# Patient Record
Sex: Male | Born: 1940 | Race: Black or African American | Hispanic: No | Marital: Married | Smoking: Never smoker
Health system: Southern US, Community
[De-identification: ages and names within clinical notes are randomized; demographics above are authoritative.]

## PROBLEM LIST (undated history)

## (undated) DIAGNOSIS — N529 Male erectile dysfunction, unspecified: Secondary | ICD-10-CM

## (undated) DIAGNOSIS — Z992 Dependence on renal dialysis: Secondary | ICD-10-CM

## (undated) DIAGNOSIS — R570 Cardiogenic shock: Secondary | ICD-10-CM

## (undated) DIAGNOSIS — J189 Pneumonia, unspecified organism: Secondary | ICD-10-CM

## (undated) DIAGNOSIS — I10 Essential (primary) hypertension: Secondary | ICD-10-CM

## (undated) DIAGNOSIS — I509 Heart failure, unspecified: Secondary | ICD-10-CM

## (undated) DIAGNOSIS — Z95810 Presence of automatic (implantable) cardiac defibrillator: Secondary | ICD-10-CM

## (undated) DIAGNOSIS — E119 Type 2 diabetes mellitus without complications: Secondary | ICD-10-CM

## (undated) DIAGNOSIS — N186 End stage renal disease: Secondary | ICD-10-CM

## (undated) HISTORY — DX: Essential (primary) hypertension: I10

## (undated) HISTORY — PX: GANGLION CYST EXCISION: SHX1691

## (undated) HISTORY — PX: CARPAL TUNNEL RELEASE: SHX101

## (undated) HISTORY — DX: Male erectile dysfunction, unspecified: N52.9

## (undated) HISTORY — DX: Cardiogenic shock: R57.0

## (undated) HISTORY — PX: ARTERIOVENOUS GRAFT PLACEMENT: SUR1029

---

## 2004-09-27 HISTORY — PX: SHUNT EXTERNALIZATION: SHX341

## 2005-09-27 HISTORY — PX: CATARACT EXTRACTION W/ INTRAOCULAR LENS  IMPLANT, BILATERAL: SHX1307

## 2009-09-27 DIAGNOSIS — R570 Cardiogenic shock: Secondary | ICD-10-CM

## 2009-09-27 DIAGNOSIS — Z95810 Presence of automatic (implantable) cardiac defibrillator: Secondary | ICD-10-CM

## 2009-09-27 HISTORY — DX: Cardiogenic shock: R57.0

## 2009-09-27 HISTORY — PX: CARDIAC DEFIBRILLATOR PLACEMENT: SHX171

## 2009-09-27 HISTORY — DX: Presence of automatic (implantable) cardiac defibrillator: Z95.810

## 2013-01-25 ENCOUNTER — Encounter: Payer: Self-pay | Admitting: Internal Medicine

## 2013-02-09 ENCOUNTER — Encounter (INDEPENDENT_AMBULATORY_CARE_PROVIDER_SITE_OTHER): Payer: Self-pay | Admitting: Ophthalmology

## 2013-02-14 ENCOUNTER — Encounter (INDEPENDENT_AMBULATORY_CARE_PROVIDER_SITE_OTHER): Payer: Medicare Other | Admitting: Ophthalmology

## 2013-02-14 DIAGNOSIS — E11359 Type 2 diabetes mellitus with proliferative diabetic retinopathy without macular edema: Secondary | ICD-10-CM

## 2013-02-14 DIAGNOSIS — H35039 Hypertensive retinopathy, unspecified eye: Secondary | ICD-10-CM

## 2013-02-14 DIAGNOSIS — I10 Essential (primary) hypertension: Secondary | ICD-10-CM

## 2013-02-14 DIAGNOSIS — E1139 Type 2 diabetes mellitus with other diabetic ophthalmic complication: Secondary | ICD-10-CM

## 2013-02-14 DIAGNOSIS — H4310 Vitreous hemorrhage, unspecified eye: Secondary | ICD-10-CM

## 2013-02-14 DIAGNOSIS — H43819 Vitreous degeneration, unspecified eye: Secondary | ICD-10-CM

## 2013-02-23 ENCOUNTER — Encounter (INDEPENDENT_AMBULATORY_CARE_PROVIDER_SITE_OTHER): Payer: Medicare Other | Admitting: Ophthalmology

## 2013-03-02 ENCOUNTER — Encounter (INDEPENDENT_AMBULATORY_CARE_PROVIDER_SITE_OTHER): Payer: Medicare Other | Admitting: Ophthalmology

## 2013-03-05 ENCOUNTER — Encounter (INDEPENDENT_AMBULATORY_CARE_PROVIDER_SITE_OTHER): Payer: Medicare Other | Admitting: Ophthalmology

## 2013-03-05 DIAGNOSIS — E1139 Type 2 diabetes mellitus with other diabetic ophthalmic complication: Secondary | ICD-10-CM

## 2013-03-05 DIAGNOSIS — E11359 Type 2 diabetes mellitus with proliferative diabetic retinopathy without macular edema: Secondary | ICD-10-CM

## 2013-03-06 ENCOUNTER — Encounter: Payer: Self-pay | Admitting: *Deleted

## 2013-03-07 ENCOUNTER — Encounter: Payer: Self-pay | Admitting: Internal Medicine

## 2013-03-07 ENCOUNTER — Ambulatory Visit (INDEPENDENT_AMBULATORY_CARE_PROVIDER_SITE_OTHER): Payer: Medicare Other | Admitting: Internal Medicine

## 2013-03-07 VITALS — BP 168/78 | HR 64 | Ht 70.0 in | Wt 277.8 lb

## 2013-03-07 DIAGNOSIS — I498 Other specified cardiac arrhythmias: Secondary | ICD-10-CM

## 2013-03-07 DIAGNOSIS — I10 Essential (primary) hypertension: Secondary | ICD-10-CM

## 2013-03-07 DIAGNOSIS — R55 Syncope and collapse: Secondary | ICD-10-CM

## 2013-03-07 NOTE — Patient Instructions (Addendum)
Your physician wants you to follow-up in: 12 months with Dr Allred You will receive a reminder letter in the mail two months in advance. If you don't receive a letter, please call our office to schedule the follow-up appointment.    Remote monitoring is used to monitor your Pacemaker of ICD from home. This monitoring reduces the number of office visits required to check your device to one time per year. It allows us to keep an eye on the functioning of your device to ensure it is working properly. You are scheduled for a device check from home on 06/11/13. You may send your transmission at any time that day. If you have a wireless device, the transmission will be sent automatically. After your physician reviews your transmission, you will receive a postcard with your next transmission date.   

## 2013-03-09 ENCOUNTER — Telehealth: Payer: Self-pay | Admitting: Internal Medicine

## 2013-03-09 DIAGNOSIS — Z95810 Presence of automatic (implantable) cardiac defibrillator: Secondary | ICD-10-CM | POA: Insufficient documentation

## 2013-03-09 LAB — ICD DEVICE OBSERVATION
AL IMPEDENCE ICD: 350 Ohm
ATRIAL PACING ICD: 19 pct
BAMS-0003: 80 {beats}/min
CHARGE TIME: 9.6 s
DEV-0020ICD: NEGATIVE
HV IMPEDENCE: 44 Ohm
RV LEAD IMPEDENCE ICD: 440 Ohm
TOT-0006: 20111025000000
TOT-0007: 0
TOT-0008: 0
TOT-0009: 0
TOT-0010: 8
TZAT-0012SLOWVT: 200 ms
TZAT-0013SLOWVT: 3
TZAT-0018SLOWVT: NEGATIVE
TZAT-0020SLOWVT: 1 ms
TZON-0003SLOWVT: 315 ms
TZON-0005SLOWVT: 6
TZST-0001SLOWVT: 2
TZST-0001SLOWVT: 4
TZST-0001SLOWVT: 5
TZST-0003SLOWVT: 36 J

## 2013-03-09 NOTE — Telephone Encounter (Signed)
ROI faxed to [facility] at [phone] 03/09/13/km

## 2013-03-19 ENCOUNTER — Other Ambulatory Visit (INDEPENDENT_AMBULATORY_CARE_PROVIDER_SITE_OTHER): Payer: Medicare Other | Admitting: Ophthalmology

## 2013-03-21 ENCOUNTER — Encounter: Payer: Self-pay | Admitting: Internal Medicine

## 2013-03-21 DIAGNOSIS — I10 Essential (primary) hypertension: Secondary | ICD-10-CM | POA: Insufficient documentation

## 2013-03-21 DIAGNOSIS — R55 Syncope and collapse: Secondary | ICD-10-CM | POA: Insufficient documentation

## 2013-03-21 NOTE — Progress Notes (Signed)
Primary Cardiologist:  Dr Charlesetta Ivory is a 72 y.o. male with a h/o syncope sp ICD (SJM) by Dr Mikael Spray at [facility] cardiology in [place] who presents today to establish care in the Electrophysiology device clinic.  He reports having several episodes of syncope.  He recalls having a family history of sudden death and therefore had an ICD implanted.  He is unable to provide any additional information about the decisions related to his device implantation.  He recently moved to our area.  The patient reports doing very well since having a defibrillator implanted and remains very active despite his age.   Today, he  denies symptoms of palpitations, chest pain, shortness of breath, orthopnea, PND, lower extremity edema, dizziness, presyncope, syncope, or neurologic sequela.  The patientis tolerating medications without difficulties and is otherwise without complaint today.   Past Medical History  Diagnosis Date  . End stage renal disease     on dialysis in [place]  . Cardiac defibrillator in place 2011    for syncope with FH of sudden death  . Diabetes mellitus   . Erectile dysfunction   . HTN (hypertension)   . Cardiovascular collapse 2011   Past Surgical History  Procedure Laterality Date  . Ganglion cyst excision    . Cataracts Bilateral 2007  . Cardiac defibrillator placement  2011    [employer] medical, implanted in [place] for syncope with FH of sudden death  . Shunt externalization  2006    History   Social History  . Marital Status: Married    Spouse Name: N/A    Number of Children: 3  . Years of Education: N/A   Occupational History  . retired     Tourist information centre manager   Social History Main Topics  . Smoking status: Never Smoker   . Smokeless tobacco: Not on file  . Alcohol Use: No  . Drug Use: No  . Sexually Active: Not on file   Other Topics Concern  . Not on file   Social History Narrative  . No narrative on file    Family  History  Problem Relation Age of Onset  . Diabetes Father     deceased  . Hypertension Father     deceased  . Diabetes Mother     deceased  . Hypertension Mother     deceased  . Coronary artery disease Mother     deceased  . Coronary artery disease Maternal Grandmother   . Diabetes Maternal Grandmother     No Known Allergies  Current Outpatient Prescriptions  Medication Sig Dispense Refill  . aspirin 81 MG tablet Take 81 mg by mouth daily.      . ferrous sulfate 325 (65 FE) MG tablet Take 325 mg by mouth 2 (two) times daily.      . insulin aspart (NOVOLOG) 100 UNIT/ML injection Inject 15 Units into the skin 3 (three) times daily with meals.      . metoprolol (LOPRESSOR) 50 MG tablet Take 50 mg by mouth 2 (two) times daily.      . Multiple Vitamins-Minerals (CENTRUM SILVER PO) Take by mouth daily.      . sevelamer carbonate (RENVELA) 800 MG tablet Take 800 mg by mouth 3 (three) times daily with meals.        No current facility-administered medications for this visit.    ROS- all systems are reviewed and negative except as per HPI  Physical Exam: Filed Vitals:  03/07/13 1523  BP: 168/78  Pulse: 64  Height: 5\' 10"  (1.778 m)  Weight: 277 lb 12.8 oz (126.009 kg)    GEN- The patient is well appearing, alert and oriented x 3 today.   Head- normocephalic, atraumatic Eyes-  Sclera clear, conjunctiva pink Ears- hearing intact Oropharynx- clear Neck- supple, no JVP Lymph- no cervical lymphadenopathy Lungs- Clear to ausculation bilaterally, normal work of breathing Chest- pacemaker pocket is well healed Heart- Regular rate and rhythm, no murmurs, rubs or gallops, PMI not laterally displaced GI- soft, NT, ND, + BS Extremities- no clubbing, cyanosis, + edema Neuro- strength and sensation are intact  Pacemaker interrogation- reviewed in detail today,  See PACEART report ekg today is reviewed Dr Lonn [place] records are reviewed  Assessment and Plan:  1. Syncope s/p  ICD Normal ICD function See [facility] Art report Will try to obtain records regarding decisions about device implantation and programming No changes today  2. HTN No changes today

## 2013-03-26 ENCOUNTER — Other Ambulatory Visit (INDEPENDENT_AMBULATORY_CARE_PROVIDER_SITE_OTHER): Payer: Medicare Other | Admitting: Ophthalmology

## 2013-03-27 ENCOUNTER — Telehealth: Payer: Self-pay | Admitting: Internal Medicine

## 2013-03-27 NOTE — Telephone Encounter (Signed)
CAlled to check up on ROI faxed x2 to [facility] spoke with  [place]/MR she will refax records Today  03/27/13/KM

## 2013-03-29 ENCOUNTER — Telehealth: Payer: Self-pay | Admitting: Internal Medicine

## 2013-03-29 NOTE — Telephone Encounter (Signed)
Records Rec From [facility] gave to [facility] 03/29/13/KM

## 2013-04-30 ENCOUNTER — Ambulatory Visit: Payer: Self-pay | Admitting: Vascular Surgery

## 2013-06-11 ENCOUNTER — Encounter: Payer: Medicare Other | Admitting: *Deleted

## 2013-06-15 ENCOUNTER — Encounter: Payer: Self-pay | Admitting: *Deleted

## 2013-07-06 ENCOUNTER — Encounter: Payer: Self-pay | Admitting: Interventional Cardiology

## 2013-07-06 ENCOUNTER — Ambulatory Visit (INDEPENDENT_AMBULATORY_CARE_PROVIDER_SITE_OTHER): Payer: Medicare Other | Admitting: Interventional Cardiology

## 2013-07-06 VITALS — BP 154/82 | HR 63 | Ht 70.0 in | Wt 272.0 lb

## 2013-07-06 DIAGNOSIS — Z95810 Presence of automatic (implantable) cardiac defibrillator: Secondary | ICD-10-CM

## 2013-07-06 DIAGNOSIS — I10 Essential (primary) hypertension: Secondary | ICD-10-CM

## 2013-07-06 DIAGNOSIS — N186 End stage renal disease: Secondary | ICD-10-CM

## 2013-07-06 DIAGNOSIS — I5022 Chronic systolic (congestive) heart failure: Secondary | ICD-10-CM | POA: Insufficient documentation

## 2013-07-06 DIAGNOSIS — R55 Syncope and collapse: Secondary | ICD-10-CM

## 2013-07-06 DIAGNOSIS — I5032 Chronic diastolic (congestive) heart failure: Secondary | ICD-10-CM

## 2013-07-06 NOTE — Progress Notes (Signed)
Patient ID: Ryan Campbell, male   DOB: 1940-10-23, 72 y.o.   MRN: [id number]   HPI The patient has no complaints. He is seeing Dr. Johney Frame. He denies palpitations, and AICD discharge. There are no heart failure, symptoms, or angina. He denies peripheral edema. Occasional low blood pressures, on hemodialysis. No episodes of syncope.  Allergies  Allergen Reactions  . Statins Cough    Current Outpatient Prescriptions  Medication Sig Dispense Refill  . aspirin 81 MG tablet Take 81 mg by mouth daily.      . ferrous sulfate 325 (65 FE) MG tablet Take 325 mg by mouth 2 (two) times daily.      . insulin aspart (NOVOLOG) 100 UNIT/ML injection Inject 15 Units into the skin 3 (three) times daily with meals.      . metoprolol (LOPRESSOR) 50 MG tablet Take 50 mg by mouth 2 (two) times daily.      . Multiple Vitamins-Minerals (CENTRUM SILVER PO) Take by mouth daily.      . sevelamer carbonate (RENVELA) 800 MG tablet Take 800 mg by mouth 3 (three) times daily with meals.        No current facility-administered medications for this visit.    Past Medical History  Diagnosis Date  . End stage renal disease     on dialysis in [place]  . Cardiac defibrillator in place 2011    for syncope with FH of sudden death  . Diabetes mellitus   . Erectile dysfunction   . HTN (hypertension)   . Cardiovascular collapse 2011    Past Surgical History  Procedure Laterality Date  . Ganglion cyst excision    . Cataracts Bilateral 2007  . Cardiac defibrillator placement  2011    [employer] medical, implanted in [place] for syncope with FH of sudden death  . Shunt externalization  2006    Family History  Problem Relation Age of Onset  . Diabetes Father     deceased  . Hypertension Father     deceased  . Diabetes Mother     deceased  . Hypertension Mother     deceased  . Coronary artery disease Mother     deceased  . Coronary artery disease Maternal Grandmother   . Diabetes Maternal Grandmother       History   Social History  . Marital Status: Married    Spouse Name: N/A    Number of Children: 3  . Years of Education: N/A   Occupational History  . retired     Tourist information centre manager   Social History Main Topics  . Smoking status: Never Smoker   . Smokeless tobacco: Not on file  . Alcohol Use: No  . Drug Use: No  . Sexual Activity: Not on file   Other Topics Concern  . Not on file   Social History Narrative  . No narrative on file    ROS: No neurological symptoms. He is sedentary. Denies cough, wheezes, and hemoptysis, for  PHYSICAL EXAM BP 154/82  Pulse 63  Ht 5\' 10"  (1.778 m)  Wt 272 lb (123.378 kg)  BMI 39.03 kg/m2  EKG: A-V dissociation, occasional ventricular pacing, interventricular conduction delay with a left bundle appearance. Anterior T-wave abnormality.  ASSESSMENT AND PLAN  1. History of collapse with a family history of sudden death, resulting in AICD implantation 7 years ago.  2. End-stage renal disease, on chronic hemodialysis.  3. presumed chronic diastolic heart failure. We need to assess LV function  4.  Hypertension, under control.  Overall, the patient is stable. We will have him undergo an echocardiogram to assess LV function. He'll otherwise plan to see him back in 6-9 months

## 2013-07-06 NOTE — Patient Instructions (Signed)
Your physician recommends that you continue on your current medications as directed. Please refer to the Current Medication list given to you today.  Your physician has requested that you have an echocardiogram. Echocardiography is a painless test that uses sound waves to create images of your heart. It provides your doctor with information about the size and shape of your heart and how well your heart's chambers and valves are working. This procedure takes approximately one hour. There are no restrictions for this procedure.  Your physician recommends that you schedule a follow-up appointment in: 6 months with Dr.Smith

## 2013-07-25 ENCOUNTER — Ambulatory Visit (HOSPITAL_COMMUNITY): Payer: Medicare Other | Attending: Interventional Cardiology | Admitting: Radiology

## 2013-07-25 ENCOUNTER — Other Ambulatory Visit (HOSPITAL_COMMUNITY): Payer: Federal, State, Local not specified - PPO | Admitting: *Deleted

## 2013-07-25 ENCOUNTER — Telehealth: Payer: Self-pay

## 2013-07-25 DIAGNOSIS — R55 Syncope and collapse: Secondary | ICD-10-CM | POA: Insufficient documentation

## 2013-07-25 DIAGNOSIS — I5032 Chronic diastolic (congestive) heart failure: Secondary | ICD-10-CM

## 2013-07-25 DIAGNOSIS — Z6830 Body mass index (BMI) 30.0-30.9, adult: Secondary | ICD-10-CM | POA: Insufficient documentation

## 2013-07-25 DIAGNOSIS — Z992 Dependence on renal dialysis: Secondary | ICD-10-CM | POA: Insufficient documentation

## 2013-07-25 DIAGNOSIS — I10 Essential (primary) hypertension: Secondary | ICD-10-CM | POA: Insufficient documentation

## 2013-07-25 DIAGNOSIS — I5030 Unspecified diastolic (congestive) heart failure: Secondary | ICD-10-CM | POA: Insufficient documentation

## 2013-07-25 DIAGNOSIS — I079 Rheumatic tricuspid valve disease, unspecified: Secondary | ICD-10-CM | POA: Insufficient documentation

## 2013-07-25 DIAGNOSIS — I509 Heart failure, unspecified: Secondary | ICD-10-CM

## 2013-07-25 DIAGNOSIS — I059 Rheumatic mitral valve disease, unspecified: Secondary | ICD-10-CM | POA: Insufficient documentation

## 2013-07-25 MED ORDER — PERFLUTREN PROTEIN A MICROSPH IV SUSP
3.0000 mL | Freq: Once | INTRAVENOUS | Status: AC
  Administered 2013-07-25: 3 mL via INTRAVENOUS

## 2013-07-25 NOTE — Telephone Encounter (Signed)
Message copied by Jarvis Newcomer on Wed Jul 25, 2013  5:32 PM ------      Message from: Verdis Prime      Created: Wed Jul 25, 2013  5:17 PM       Heart strength is mildly reduced. This is surprisingly good from my viewpoint. F/U in 6 months as planned. ------

## 2013-07-25 NOTE — Telephone Encounter (Signed)
called pt to give to give him echo results.mailbox full unable to leave a message

## 2013-07-25 NOTE — Progress Notes (Signed)
Echocardiogram performed with optison.  

## 2013-07-27 ENCOUNTER — Telehealth: Payer: Self-pay

## 2013-07-27 NOTE — Telephone Encounter (Signed)
Message copied by Jarvis Newcomer on Fri Jul 27, 2013  8:58 AM ------      Message from: Verdis Prime      Created: Wed Jul 25, 2013  5:17 PM       Heart strength is mildly reduced. This is surprisingly good from my viewpoint. F/U in 6 months as planned. ------

## 2013-07-27 NOTE — Telephone Encounter (Signed)
called to give pt results of echo.unable to to leave message on pt voicemail

## 2013-07-30 NOTE — Telephone Encounter (Signed)
2nd attempt unable to lmom mailbox is full.recall put in for pt 6 mo f/u

## 2013-12-05 ENCOUNTER — Ambulatory Visit: Payer: Self-pay | Admitting: Vascular Surgery

## 2014-01-11 ENCOUNTER — Ambulatory Visit: Payer: Medicare Other | Admitting: Interventional Cardiology

## 2014-02-25 ENCOUNTER — Encounter (INDEPENDENT_AMBULATORY_CARE_PROVIDER_SITE_OTHER): Payer: Medicare Other | Admitting: Ophthalmology

## 2014-02-25 DIAGNOSIS — H4310 Vitreous hemorrhage, unspecified eye: Secondary | ICD-10-CM

## 2014-02-25 DIAGNOSIS — H43819 Vitreous degeneration, unspecified eye: Secondary | ICD-10-CM

## 2014-02-25 DIAGNOSIS — H211X9 Other vascular disorders of iris and ciliary body, unspecified eye: Secondary | ICD-10-CM

## 2014-02-25 DIAGNOSIS — E1139 Type 2 diabetes mellitus with other diabetic ophthalmic complication: Secondary | ICD-10-CM

## 2014-02-25 DIAGNOSIS — E1165 Type 2 diabetes mellitus with hyperglycemia: Secondary | ICD-10-CM

## 2014-02-25 DIAGNOSIS — E11359 Type 2 diabetes mellitus with proliferative diabetic retinopathy without macular edema: Secondary | ICD-10-CM

## 2014-03-11 ENCOUNTER — Ambulatory Visit: Payer: Medicare Other | Admitting: Interventional Cardiology

## 2014-03-11 ENCOUNTER — Ambulatory Visit: Payer: Self-pay | Admitting: Vascular Surgery

## 2014-03-13 ENCOUNTER — Other Ambulatory Visit (INDEPENDENT_AMBULATORY_CARE_PROVIDER_SITE_OTHER): Payer: Medicare Other | Admitting: Ophthalmology

## 2014-03-13 DIAGNOSIS — H3581 Retinal edema: Secondary | ICD-10-CM

## 2014-03-20 ENCOUNTER — Ambulatory Visit (INDEPENDENT_AMBULATORY_CARE_PROVIDER_SITE_OTHER): Payer: Medicare Other | Admitting: Internal Medicine

## 2014-03-20 ENCOUNTER — Encounter: Payer: Self-pay | Admitting: Internal Medicine

## 2014-03-20 VITALS — BP 118/62 | HR 76 | Ht 70.5 in | Wt 276.1 lb

## 2014-03-20 DIAGNOSIS — R55 Syncope and collapse: Secondary | ICD-10-CM

## 2014-03-20 DIAGNOSIS — Z95810 Presence of automatic (implantable) cardiac defibrillator: Secondary | ICD-10-CM

## 2014-03-20 DIAGNOSIS — I10 Essential (primary) hypertension: Secondary | ICD-10-CM

## 2014-03-20 DIAGNOSIS — I5032 Chronic diastolic (congestive) heart failure: Secondary | ICD-10-CM

## 2014-03-20 NOTE — Patient Instructions (Signed)
Your physician wants you to follow-up in: 12 months with Dr Vallery Ridge will receive a reminder letter in the mail two months in advance. If you don't receive a letter, please call our office to schedule the follow-up appointment.    Remote monitoring is used to monitor your Pacemaker or ICD from home. This monitoring reduces the number of office visits required to check your device to one time per year. It allows [place] to keep an eye on the functioning of your device to ensure it is working properly. You are scheduled for a device check from home on 06/19/14. You may send your transmission at any time that day. If you have a wireless device, the transmission will be sent automatically. After your physician reviews your transmission, you will receive a postcard with your next transmission date.

## 2014-03-21 ENCOUNTER — Inpatient Hospital Stay: Payer: Self-pay | Admitting: Internal Medicine

## 2014-03-21 LAB — BASIC METABOLIC PANEL
ANION GAP: 13 (ref 7–16)
BUN: 22 mg/dL — AB (ref 7–18)
CALCIUM: 9.1 mg/dL (ref 8.5–10.1)
CHLORIDE: 93 mmol/L — AB (ref 98–107)
CREATININE: 4.65 mg/dL — AB (ref 0.60–1.30)
Co2: 25 mmol/L (ref 21–32)
EGFR (African American): 14 — ABNORMAL LOW
EGFR (Non-African Amer.): 12 — ABNORMAL LOW
GLUCOSE: 206 mg/dL — AB (ref 65–99)
OSMOLALITY: 272 (ref 275–301)
POTASSIUM: 3.9 mmol/L (ref 3.5–5.1)
Sodium: 131 mmol/L — ABNORMAL LOW (ref 136–145)

## 2014-03-21 LAB — CBC WITH DIFFERENTIAL/PLATELET
BASOS PCT: 0.7 %
Basophil #: 0.1 10*3/uL (ref 0.0–0.1)
EOS ABS: 0 10*3/uL (ref 0.0–0.7)
EOS PCT: 0 %
HCT: 39.1 % — ABNORMAL LOW (ref 40.0–52.0)
HGB: 12.7 g/dL — ABNORMAL LOW (ref 13.0–18.0)
Lymphocyte #: 0.7 10*3/uL — ABNORMAL LOW (ref 1.0–3.6)
Lymphocyte %: 8.5 %
MCH: 30.3 pg (ref 26.0–34.0)
MCHC: 32.5 g/dL (ref 32.0–36.0)
MCV: 93 fL (ref 80–100)
MONOS PCT: 10.1 %
Monocyte #: 0.8 x10 3/mm (ref 0.2–1.0)
NEUTROS ABS: 6.6 10*3/uL — AB (ref 1.4–6.5)
Neutrophil %: 80.7 %
Platelet: 118 10*3/uL — ABNORMAL LOW (ref 150–440)
RBC: 4.2 10*6/uL — ABNORMAL LOW (ref 4.40–5.90)
RDW: 18 % — ABNORMAL HIGH (ref 11.5–14.5)
WBC: 8.2 10*3/uL (ref 3.8–10.6)

## 2014-03-21 LAB — TROPONIN I: Troponin-I: 0.08 ng/mL — ABNORMAL HIGH

## 2014-03-22 LAB — BASIC METABOLIC PANEL
ANION GAP: 8 (ref 7–16)
BUN: 31 mg/dL — AB (ref 7–18)
CALCIUM: 9 mg/dL (ref 8.5–10.1)
Chloride: 93 mmol/L — ABNORMAL LOW (ref 98–107)
Co2: 31 mmol/L (ref 21–32)
Creatinine: 5.76 mg/dL — ABNORMAL HIGH (ref 0.60–1.30)
EGFR (African American): 11 — ABNORMAL LOW
GFR CALC NON AF AMER: 9 — AB
GLUCOSE: 211 mg/dL — AB (ref 65–99)
Osmolality: 277 (ref 275–301)
Potassium: 4.3 mmol/L (ref 3.5–5.1)
Sodium: 132 mmol/L — ABNORMAL LOW (ref 136–145)

## 2014-03-22 LAB — URINALYSIS, COMPLETE
Bilirubin,UR: NEGATIVE
Hyaline Cast: 4
KETONE: NEGATIVE
Leukocyte Esterase: NEGATIVE
NITRITE: NEGATIVE
Ph: 5 (ref 4.5–8.0)
Protein: >=500
SPECIFIC GRAVITY: 1.022 (ref 1.003–1.030)
Squamous Epithelial: <1
WBC UR: 7 /HPF (ref 0–5)

## 2014-03-22 LAB — CBC WITH DIFFERENTIAL/PLATELET
BASOS ABS: 0 10*3/uL (ref 0.0–0.1)
BASOS PCT: 0.6 %
EOS ABS: 0 10*3/uL (ref 0.0–0.7)
EOS PCT: 0 %
HCT: 40.5 % (ref 40.0–52.0)
HGB: 13.3 g/dL (ref 13.0–18.0)
LYMPHS PCT: 14 %
Lymphocyte #: 1.1 10*3/uL (ref 1.0–3.6)
MCH: 30.6 pg (ref 26.0–34.0)
MCHC: 32.8 g/dL (ref 32.0–36.0)
MCV: 93 fL (ref 80–100)
MONO ABS: 1.1 x10 3/mm — AB (ref 0.2–1.0)
MONOS PCT: 15.1 %
NEUTROS PCT: 70.3 %
Neutrophil #: 5.3 10*3/uL (ref 1.4–6.5)
Platelet: 107 10*3/uL — ABNORMAL LOW (ref 150–440)
RBC: 4.34 10*6/uL — AB (ref 4.40–5.90)
RDW: 17.7 % — AB (ref 11.5–14.5)
WBC: 7.5 10*3/uL (ref 3.8–10.6)

## 2014-03-23 LAB — BASIC METABOLIC PANEL
Anion Gap: 11 (ref 7–16)
BUN: 47 mg/dL — ABNORMAL HIGH (ref 7–18)
CALCIUM: 8.9 mg/dL (ref 8.5–10.1)
CHLORIDE: 92 mmol/L — AB (ref 98–107)
CO2: 28 mmol/L (ref 21–32)
CREATININE: 7.98 mg/dL — AB (ref 0.60–1.30)
EGFR (African American): 7 — ABNORMAL LOW
EGFR (Non-African Amer.): 6 — ABNORMAL LOW
GLUCOSE: 205 mg/dL — AB (ref 65–99)
Osmolality: 281 (ref 275–301)
POTASSIUM: 4.4 mmol/L (ref 3.5–5.1)
SODIUM: 131 mmol/L — AB (ref 136–145)

## 2014-03-23 LAB — PHOSPHORUS: Phosphorus: 4 mg/dL (ref 2.5–4.9)

## 2014-03-23 LAB — CK: CK, Total: 1195 U/L — ABNORMAL HIGH

## 2014-03-23 NOTE — Progress Notes (Signed)
Primary Cardiologist:  Dr Annitta Needs is a 73 y.o. male with a h/o syncope sp ICD (SJM) by Dr Ivar Bury at [facility] cardiology in [place] who presents today for follow-up in the Electrophysiology device clinic. He has done very well since I saw him last.  Today, he  denies symptoms of palpitations, chest pain, shortness of breath, orthopnea, PND, lower extremity edema, dizziness, presyncope, syncope, or neurologic sequela.  The patientis tolerating medications without difficulties and is otherwise without complaint today.   Past Medical History  Diagnosis Date  . End stage renal disease     on dialysis in [place]  . Cardiac defibrillator in place 2011    for syncope with FH of sudden death  . Diabetes mellitus   . Erectile dysfunction   . HTN (hypertension)   . Cardiovascular collapse 2011   Past Surgical History  Procedure Laterality Date  . Ganglion cyst excision    . Cataracts Bilateral 2007  . Cardiac defibrillator placement  2011    [employer] medical, implanted in [place] for syncope with FH of sudden death  . Shunt externalization  2006    History   Social History  . Marital Status: Married    Spouse Name: N/A    Number of Children: 3  . Years of Education: N/A   Occupational History  . retired     Automotive engineer   Social History Main Topics  . Smoking status: Never Smoker   . Smokeless tobacco: Not on file  . Alcohol Use: No  . Drug Use: No  . Sexual Activity: Not on file   Other Topics Concern  . Not on file   Social History Narrative  . No narrative on file    Family History  Problem Relation Age of Onset  . Diabetes Father     deceased  . Hypertension Father     deceased  . Diabetes Mother     deceased  . Hypertension Mother     deceased  . Coronary artery disease Mother     deceased  . Coronary artery disease Maternal Grandmother   . Diabetes Maternal Grandmother     Allergies  Allergen Reactions    . Statins Cough    Current Outpatient Prescriptions  Medication Sig Dispense Refill  . aspirin 81 MG tablet Take 81 mg by mouth daily.      . ferrous sulfate 325 (65 FE) MG tablet Take 325 mg by mouth 2 (two) times daily.      . insulin aspart (NOVOLOG) 100 UNIT/ML injection Inject 15 Units into the skin 3 (three) times daily with meals.      . metoprolol (LOPRESSOR) 50 MG tablet Take 50 mg by mouth 2 (two) times daily.      . Multiple Vitamins-Minerals (CENTRUM SILVER PO) Take 1 capsule by mouth daily.       . sevelamer carbonate (RENVELA) 800 MG tablet Take 800 mg by mouth 3 (three) times daily with meals.        No current facility-administered medications for this visit.    ROS- all systems are reviewed and negative except as per HPI  Physical Exam: Filed Vitals:   03/20/14 0948  BP: 118/62  Pulse: 76  Height: 5' 10.5" (1.791 m)  Weight: 276 lb 1.9 oz (125.247 kg)    GEN- The patient is well appearing, alert and oriented x 3 today.   Head- normocephalic, atraumatic Eyes-  Sclera clear, conjunctiva  pink Ears- hearing intact Oropharynx- clear Neck- supple, no JVP Lymph- no cervical lymphadenopathy Lungs- Clear to ausculation bilaterally, normal work of breathing Chest- pacemaker pocket is well healed Heart- Regular rate and rhythm, no murmurs, rubs or gallops, PMI not laterally displaced GI- soft, NT, ND, + BS Extremities- no clubbing, cyanosis, + edema Neuro- strength and sensation are intact  Pacemaker interrogation- reviewed in detail today,  See PACEART report Dr Darliss Ridgel records are reviewed  Assessment and Plan:  1. Syncope s/p ICD Normal ICD function See Pace Art report No changes today  2. HTN No changes today  Merlin Return to see me in 1 year Follow-up with Dr Tamala Julian as planned

## 2014-03-25 LAB — MDC_IDC_ENUM_SESS_TYPE_INCLINIC
Battery Remaining Longevity: 64.8 mo
Brady Statistic RA Percent Paced: 34 %
HighPow Impedance: 38 Ohm
Implantable Pulse Generator Serial Number: 789077
Lead Channel Impedance Value: 400 Ohm
Lead Channel Pacing Threshold Amplitude: 0.625 V
Lead Channel Pacing Threshold Amplitude: 1 V
Lead Channel Pacing Threshold Pulse Width: 0.5 ms
Lead Channel Pacing Threshold Pulse Width: 0.5 ms
Lead Channel Sensing Intrinsic Amplitude: 12 mV
Lead Channel Sensing Intrinsic Amplitude: 5 mV
Lead Channel Setting Pacing Amplitude: 0.875
Lead Channel Setting Pacing Amplitude: 2 V
Lead Channel Setting Pacing Pulse Width: 0.5 ms
MDC IDC MSMT LEADCHNL RA PACING THRESHOLD AMPLITUDE: 1 V
MDC IDC MSMT LEADCHNL RV IMPEDANCE VALUE: 400 Ohm
MDC IDC MSMT LEADCHNL RV PACING THRESHOLD PULSEWIDTH: 0.5 ms
MDC IDC SESS DTM: 20150624135514
MDC IDC SET LEADCHNL RV SENSING SENSITIVITY: 0.5 mV
MDC IDC STAT BRADY RV PERCENT PACED: 73 %
Zone Setting Detection Interval: 270 ms
Zone Setting Detection Interval: 315 ms

## 2014-03-25 LAB — HEPATIC FUNCTION PANEL A (ARMC)
ALBUMIN: 3 g/dL — AB (ref 3.4–5.0)
ALK PHOS: 53 U/L
ALT: 38 U/L (ref 12–78)
Bilirubin, Direct: 0.7 mg/dL — ABNORMAL HIGH (ref 0.00–0.20)
Bilirubin,Total: 1.5 mg/dL — ABNORMAL HIGH (ref 0.2–1.0)
SGOT(AST): 71 U/L — ABNORMAL HIGH (ref 15–37)
TOTAL PROTEIN: 8.2 g/dL (ref 6.4–8.2)

## 2014-03-26 DIAGNOSIS — I369 Nonrheumatic tricuspid valve disorder, unspecified: Secondary | ICD-10-CM

## 2014-03-26 LAB — CBC WITH DIFFERENTIAL/PLATELET
Basophil #: 0.1 10*3/uL (ref 0.0–0.1)
Basophil %: 1 %
EOS PCT: 0.8 %
Eosinophil #: 0 10*3/uL (ref 0.0–0.7)
HCT: 37.8 % — AB (ref 40.0–52.0)
HGB: 12.6 g/dL — ABNORMAL LOW (ref 13.0–18.0)
LYMPHS PCT: 10.1 %
Lymphocyte #: 0.6 10*3/uL — ABNORMAL LOW (ref 1.0–3.6)
MCH: 30.6 pg (ref 26.0–34.0)
MCHC: 33.3 g/dL (ref 32.0–36.0)
MCV: 92 fL (ref 80–100)
MONO ABS: 0.7 x10 3/mm (ref 0.2–1.0)
Monocyte %: 12.1 %
Neutrophil #: 4.4 10*3/uL (ref 1.4–6.5)
Neutrophil %: 76 %
Platelet: 129 10*3/uL — ABNORMAL LOW (ref 150–440)
RBC: 4.11 10*6/uL — AB (ref 4.40–5.90)
RDW: 17.7 % — ABNORMAL HIGH (ref 11.5–14.5)
WBC: 5.7 10*3/uL (ref 3.8–10.6)

## 2014-03-26 LAB — RENAL FUNCTION PANEL
ALBUMIN: 2.9 g/dL — AB (ref 3.4–5.0)
ANION GAP: 11 (ref 7–16)
BUN: 66 mg/dL — AB (ref 7–18)
Calcium, Total: 9.4 mg/dL (ref 8.5–10.1)
Chloride: 88 mmol/L — ABNORMAL LOW (ref 98–107)
Co2: 28 mmol/L (ref 21–32)
Creatinine: 10.13 mg/dL — ABNORMAL HIGH (ref 0.60–1.30)
EGFR (Non-African Amer.): 5 — ABNORMAL LOW
GFR CALC AF AMER: 5 — AB
Glucose: 233 mg/dL — ABNORMAL HIGH (ref 65–99)
OSMOLALITY: 282 (ref 275–301)
PHOSPHORUS: 4.1 mg/dL (ref 2.5–4.9)
POTASSIUM: 4.1 mmol/L (ref 3.5–5.1)
SODIUM: 127 mmol/L — AB (ref 136–145)

## 2014-03-26 LAB — PLATELET COUNT: PLATELETS: 129 10*3/uL — AB (ref 150–440)

## 2014-03-26 LAB — CULTURE, BLOOD (SINGLE)

## 2014-03-28 LAB — CBC WITH DIFFERENTIAL/PLATELET
Basophil #: 0.1 10*3/uL (ref 0.0–0.1)
Basophil %: 1.8 %
EOS ABS: 0.1 10*3/uL (ref 0.0–0.7)
Eosinophil %: 3 %
HCT: 35.9 % — ABNORMAL LOW (ref 40.0–52.0)
HGB: 11.8 g/dL — AB (ref 13.0–18.0)
Lymphocyte #: 1 10*3/uL (ref 1.0–3.6)
Lymphocyte %: 25.2 %
MCH: 30.3 pg (ref 26.0–34.0)
MCHC: 33 g/dL (ref 32.0–36.0)
MCV: 92 fL (ref 80–100)
Monocyte #: 0.7 x10 3/mm (ref 0.2–1.0)
Monocyte %: 16.6 %
NEUTROS ABS: 2.2 10*3/uL (ref 1.4–6.5)
Neutrophil %: 53.4 %
Platelet: 175 10*3/uL (ref 150–440)
RBC: 3.91 10*6/uL — AB (ref 4.40–5.90)
RDW: 17.8 % — ABNORMAL HIGH (ref 11.5–14.5)
WBC: 4.3 10*3/uL (ref 3.8–10.6)

## 2014-03-28 LAB — RENAL FUNCTION PANEL
Albumin: 2.8 g/dL — ABNORMAL LOW (ref 3.4–5.0)
Anion Gap: 12 (ref 7–16)
BUN: 57 mg/dL — AB (ref 7–18)
CHLORIDE: 89 mmol/L — AB (ref 98–107)
CREATININE: 9.26 mg/dL — AB (ref 0.60–1.30)
Calcium, Total: 8.9 mg/dL (ref 8.5–10.1)
Co2: 28 mmol/L (ref 21–32)
EGFR (Non-African Amer.): 5 — ABNORMAL LOW
GFR CALC AF AMER: 6 — AB
Glucose: 291 mg/dL — ABNORMAL HIGH (ref 65–99)
OSMOLALITY: 285 (ref 275–301)
PHOSPHORUS: 3.5 mg/dL (ref 2.5–4.9)
Potassium: 3.8 mmol/L (ref 3.5–5.1)
Sodium: 129 mmol/L — ABNORMAL LOW (ref 136–145)

## 2014-03-28 LAB — VANCOMYCIN, TROUGH: VANCOMYCIN, TROUGH: 15 ug/mL (ref 10–20)

## 2014-03-29 LAB — CULTURE, BLOOD (SINGLE)

## 2014-03-30 LAB — CULTURE, BLOOD (SINGLE)

## 2014-04-22 ENCOUNTER — Encounter: Payer: Self-pay | Admitting: Interventional Cardiology

## 2014-04-22 ENCOUNTER — Ambulatory Visit (INDEPENDENT_AMBULATORY_CARE_PROVIDER_SITE_OTHER): Payer: Medicare Other | Admitting: Interventional Cardiology

## 2014-04-22 VITALS — BP 109/43 | HR 65 | Ht 70.0 in | Wt 274.8 lb

## 2014-04-22 DIAGNOSIS — R55 Syncope and collapse: Secondary | ICD-10-CM

## 2014-04-22 DIAGNOSIS — I5022 Chronic systolic (congestive) heart failure: Secondary | ICD-10-CM

## 2014-04-22 DIAGNOSIS — N186 End stage renal disease: Secondary | ICD-10-CM

## 2014-04-22 DIAGNOSIS — R197 Diarrhea, unspecified: Secondary | ICD-10-CM

## 2014-04-22 DIAGNOSIS — Z95810 Presence of automatic (implantable) cardiac defibrillator: Secondary | ICD-10-CM

## 2014-04-22 NOTE — Progress Notes (Signed)
Patient ID: Ryan Campbell, male   DOB: 01-20-1941, 73 y.o.   MRN: [id number]    [address], [place], [place]  [address] Phone: [phone] Fax:  [phone]  Date:  04/22/2014   ID:  Ryan Campbell, DOB [birth year], MRN [id number]  PCP:  Sinclair Grooms, MD   ASSESSMENT:  1. Chronic combined systolic and diastolic heart failure, stable 2. Chronic end stage renal disease on chronic Tuesday Thursday Saturday hemodialysis 3. History of sudden death and with AICD in place  PLAN:  1. no specific cardiac recommendations: 2. Continue to follow in the device clinic 3. With reference to chronic systolic heart failure, no specific change   SUBJECTIVE: Ryan Campbell is a 73 y.o. male who recently had pneumonia. He denies any chest pain. No dyspnea orthopnea. He is having chronic diarrhea since discharge from the hospital in July. He is to see his primary care physician about this tomorrow. He denies palpitations or AICD discharge. Is no orthopnea PND.   Wt Readings from Last 3 Encounters:  04/22/14 274 lb 12.8 oz (124.648 kg)  03/20/14 276 lb 1.9 oz (125.247 kg)  07/06/13 272 lb (123.378 kg)     Past Medical History  Diagnosis Date  . End stage renal disease     on dialysis in [place]  . Cardiac defibrillator in place 2011    for syncope with FH of sudden death  . Diabetes mellitus   . Erectile dysfunction   . HTN (hypertension)   . Cardiovascular collapse 2011    Current Outpatient Prescriptions  Medication Sig Dispense Refill  . aspirin 81 MG tablet Take 81 mg by mouth daily.      . ferrous sulfate 325 (65 FE) MG tablet Take 325 mg by mouth 2 (two) times daily.      . insulin aspart (NOVOLOG) 100 UNIT/ML injection Inject 15 Units into the skin 3 (three) times daily with meals.      . metoprolol (LOPRESSOR) 50 MG tablet Take 50 mg by mouth 2 (two) times daily.      . Multiple Vitamins-Minerals (CENTRUM SILVER PO) Take 1 capsule by mouth daily.       .  sevelamer carbonate (RENVELA) 800 MG tablet Take 800 mg by mouth 3 (three) times daily with meals.        No current facility-administered medications for this visit.    Allergies:    Allergies  Allergen Reactions  . Statins Cough    Social History:  The patient  reports that he has never smoked. He does not have any smokeless tobacco history on file. He reports that he does not drink alcohol or use illicit drugs.   ROS:  Please see the history of present illness.   No lower extremity swelling. Denies orthopnea PND. No neurological complaints. Appetite is been stable. He has been having some chills. He denies fever.   All other systems reviewed and negative.   OBJECTIVE: VS:  BP 109/43  Pulse 65  Ht 5\' 10"  (1.778 m)  Wt 274 lb 12.8 oz (124.648 kg)  BMI 39.43 kg/m2 Well nourished, well developed, in no acute distress, obese and appears stable HEENT: normal Neck: JVD flat. Carotid bruit absent  Cardiac:  normal S1, S2; RRR; no murmur Lungs:  clear to auscultation bilaterally, no wheezing, rhonchi or rales Abd: soft, nontender, no hepatomegaly Ext: Edema absent. Pulses 2+ and symmetric. Left forearm reveals a continuous bruit of AV fistula Skin: warm and  dry Neuro:  CNs 2-12 intact, no focal abnormalities noted  EKG:  Not repeated       Signed, Illene Labrador III, MD 04/22/2014 2:14 PM

## 2014-04-22 NOTE — Patient Instructions (Signed)
Your physician recommends that you continue on your current medications as directed. Please refer to the Current Medication list given to you today.  Monitor your sodium intake  Your physician wants you to follow-up in: 1 year with Dr.Smith You will receive a reminder letter in the mail two months in advance. If you don't receive a letter, please call our office to schedule the follow-up appointment.

## 2014-06-19 ENCOUNTER — Encounter: Payer: Medicare Other | Admitting: *Deleted

## 2014-06-19 ENCOUNTER — Telehealth: Payer: Self-pay | Admitting: Cardiology

## 2014-06-19 NOTE — Telephone Encounter (Signed)
LMOVM reminding pt to send remote transmission.   

## 2014-06-20 ENCOUNTER — Encounter: Payer: Self-pay | Admitting: Cardiology

## 2014-07-19 ENCOUNTER — Ambulatory Visit (INDEPENDENT_AMBULATORY_CARE_PROVIDER_SITE_OTHER): Payer: Medicare Other | Admitting: Ophthalmology

## 2014-07-19 DIAGNOSIS — E11311 Type 2 diabetes mellitus with unspecified diabetic retinopathy with macular edema: Secondary | ICD-10-CM

## 2014-07-19 DIAGNOSIS — H43813 Vitreous degeneration, bilateral: Secondary | ICD-10-CM

## 2014-07-19 DIAGNOSIS — E11351 Type 2 diabetes mellitus with proliferative diabetic retinopathy with macular edema: Secondary | ICD-10-CM

## 2014-07-19 DIAGNOSIS — E11359 Type 2 diabetes mellitus with proliferative diabetic retinopathy without macular edema: Secondary | ICD-10-CM

## 2014-07-28 DIAGNOSIS — J189 Pneumonia, unspecified organism: Secondary | ICD-10-CM

## 2014-07-28 HISTORY — DX: Pneumonia, unspecified organism: J18.9

## 2014-08-06 ENCOUNTER — Encounter: Payer: Self-pay | Admitting: Cardiology

## 2014-08-16 ENCOUNTER — Emergency Department (HOSPITAL_COMMUNITY): Payer: Medicare Other

## 2014-08-16 ENCOUNTER — Emergency Department (HOSPITAL_COMMUNITY)
Admission: EM | Admit: 2014-08-16 | Discharge: 2014-08-16 | Disposition: A | Discharge status: Home / Self Care | Payer: Medicare Other | Attending: Emergency Medicine | Admitting: Emergency Medicine

## 2014-08-16 ENCOUNTER — Encounter (HOSPITAL_COMMUNITY): Payer: Self-pay | Admitting: *Deleted

## 2014-08-16 DIAGNOSIS — R197 Diarrhea, unspecified: Secondary | ICD-10-CM | POA: Diagnosis present

## 2014-08-16 DIAGNOSIS — Z95810 Presence of automatic (implantable) cardiac defibrillator: Secondary | ICD-10-CM | POA: Insufficient documentation

## 2014-08-16 DIAGNOSIS — A047 Enterocolitis due to Clostridium difficile: Secondary | ICD-10-CM | POA: Diagnosis not present

## 2014-08-16 DIAGNOSIS — E119 Type 2 diabetes mellitus without complications: Secondary | ICD-10-CM | POA: Insufficient documentation

## 2014-08-16 DIAGNOSIS — I120 Hypertensive chronic kidney disease with stage 5 chronic kidney disease or end stage renal disease: Secondary | ICD-10-CM | POA: Diagnosis not present

## 2014-08-16 DIAGNOSIS — Z87448 Personal history of other diseases of urinary system: Secondary | ICD-10-CM | POA: Insufficient documentation

## 2014-08-16 DIAGNOSIS — Z7982 Long term (current) use of aspirin: Secondary | ICD-10-CM | POA: Diagnosis not present

## 2014-08-16 DIAGNOSIS — A0472 Enterocolitis due to Clostridium difficile, not specified as recurrent: Secondary | ICD-10-CM

## 2014-08-16 DIAGNOSIS — N186 End stage renal disease: Secondary | ICD-10-CM | POA: Insufficient documentation

## 2014-08-16 DIAGNOSIS — Z79899 Other long term (current) drug therapy: Secondary | ICD-10-CM | POA: Diagnosis not present

## 2014-08-16 DIAGNOSIS — Z794 Long term (current) use of insulin: Secondary | ICD-10-CM | POA: Diagnosis not present

## 2014-08-16 LAB — CBC WITH DIFFERENTIAL/PLATELET
Basophils Absolute: 0 10*3/uL (ref 0.0–0.1)
Basophils Relative: 0 % (ref 0–1)
Eosinophils Absolute: 0 10*3/uL (ref 0.0–0.7)
Eosinophils Relative: 0 % (ref 0–5)
HCT: 39.8 % (ref 39.0–52.0)
Hemoglobin: 13.2 g/dL (ref 13.0–17.0)
Lymphocytes Relative: 10 % — ABNORMAL LOW (ref 12–46)
Lymphs Abs: 1 10*3/uL (ref 0.7–4.0)
MCH: 30.8 pg (ref 26.0–34.0)
MCHC: 33.2 g/dL (ref 30.0–36.0)
MCV: 92.8 fL (ref 78.0–100.0)
Monocytes Absolute: 1 10*3/uL (ref 0.1–1.0)
Monocytes Relative: 10 % (ref 3–12)
Neutro Abs: 8 10*3/uL — ABNORMAL HIGH (ref 1.7–7.7)
Neutrophils Relative %: 80 % — ABNORMAL HIGH (ref 43–77)
Platelets: 97 10*3/uL — ABNORMAL LOW (ref 150–400)
RBC: 4.29 MIL/uL (ref 4.22–5.81)
RDW: 17.8 % — ABNORMAL HIGH (ref 11.5–15.5)
WBC: 10 10*3/uL (ref 4.0–10.5)

## 2014-08-16 LAB — COMPREHENSIVE METABOLIC PANEL
ALT: 11 U/L (ref 0–53)
AST: 20 U/L (ref 0–37)
Albumin: 3.5 g/dL (ref 3.5–5.2)
Alkaline Phosphatase: 75 U/L (ref 39–117)
Anion gap: 19 — ABNORMAL HIGH (ref 5–15)
BUN: 59 mg/dL — ABNORMAL HIGH (ref 6–23)
CO2: 27 mEq/L (ref 19–32)
Calcium: 9.9 mg/dL (ref 8.4–10.5)
Chloride: 89 mEq/L — ABNORMAL LOW (ref 96–112)
Creatinine, Ser: 9.66 mg/dL — ABNORMAL HIGH (ref 0.50–1.35)
GFR calc Af Amer: 6 mL/min — ABNORMAL LOW (ref >90)
GFR calc non Af Amer: 5 mL/min — ABNORMAL LOW (ref >90)
Glucose, Bld: 221 mg/dL — ABNORMAL HIGH (ref 70–99)
Potassium: 5.4 mEq/L — ABNORMAL HIGH (ref 3.7–5.3)
Sodium: 135 mEq/L — ABNORMAL LOW (ref 137–147)
Total Bilirubin: 4.9 mg/dL — ABNORMAL HIGH (ref 0.3–1.2)
Total Protein: 7.9 g/dL (ref 6.0–8.3)

## 2014-08-16 MED ORDER — METRONIDAZOLE 500 MG PO TABS: 500.0000 mg | ORAL_TABLET | Freq: Three times a day (TID) | ORAL | Status: AC

## 2014-08-16 MED ORDER — LOPERAMIDE HCL 2 MG PO CAPS: 2.0000 mg | ORAL_CAPSULE | Freq: Four times a day (QID) | ORAL | Status: AC | PRN

## 2014-08-16 MED ORDER — LOPERAMIDE HCL 2 MG PO CAPS
4.0000 mg | ORAL_CAPSULE | Freq: Once | ORAL | Status: AC
  Administered 2014-08-16: 4 mg via ORAL
  Filled 2014-08-16: qty 2

## 2014-08-16 MED ORDER — METRONIDAZOLE 500 MG PO TABS
500.0000 mg | ORAL_TABLET | Freq: Once | ORAL | Status: AC
  Administered 2014-08-16: 500 mg via ORAL
  Filled 2014-08-16: qty 1

## 2014-08-16 NOTE — ED Provider Notes (Signed)
CSN: [id number]     Arrival date & time 08/16/14  1117 History   First MD Initiated Contact with Patient 08/16/14 1154     Chief Complaint  Patient presents with  . Diarrhea     (Consider location/radiation/quality/duration/timing/severity/associated sxs/prior Treatment) HPI Patient presents to the emergency department with fatigue with diarrhea since Tuesday.  The patient states that he missed his dialysis yesterday due to not feeling well.  The patient states that he has had on and on diarrhea over the last 6 months.  The patient states that he was treated for C. difficile at that point.  The patient states that he has not had any chest pain, shortness of breath, fever, weakness, dizziness, headache, blurred vision, back pain, dysuria, lightheadedness, nausea, vomiting, abdominal pain, or syncope.  Patient states that this is make his condition better or worse. Past Medical History  Diagnosis Date  . End stage renal disease     on dialysis in [place]  . Cardiac defibrillator in place 2011    for syncope with FH of sudden death  . Diabetes mellitus   . Erectile dysfunction   . HTN (hypertension)   . Cardiovascular collapse 2011   Past Surgical History  Procedure Laterality Date  . Ganglion cyst excision    . Cataracts Bilateral 2007  . Cardiac defibrillator placement  2011    [employer], implanted in [place] for syncope with FH of sudden death  . Shunt externalization  2006   Family History  Problem Relation Age of Onset  . Diabetes Father     deceased  . Hypertension Father     deceased  . Diabetes Mother     deceased  . Hypertension Mother     deceased  . Coronary artery disease Mother     deceased  . Coronary artery disease Maternal Grandmother   . Diabetes Maternal Grandmother    History  Substance Use Topics  . Smoking status: Never Smoker   . Smokeless tobacco: Not on file  . Alcohol Use: No    Review of Systems  All other systems negative  except as documented in the HPI. All pertinent positives and negatives as reviewed in the HPI.   Allergies  Statins  Home Medications   Prior to Admission medications   Medication Sig Start Date End Date Taking? Authorizing Provider  B Complex-C-Folic Acid (NEPHROCAPS PO) Take 1 capsule by mouth daily.   Yes Historical Provider, MD  ferrous sulfate 325 (65 FE) MG tablet Take 325 mg by mouth 2 (two) times daily.   Yes Historical Provider, MD  insulin regular (NOVOLIN R,HUMULIN R) 100 units/mL injection Inject 2 Units into the skin daily after supper.   Yes Historical Provider, MD  aspirin 81 MG tablet Take 81 mg by mouth daily.    Historical Provider, MD  insulin aspart (NOVOLOG) 100 UNIT/ML injection Inject 2 Units into the skin daily after supper.     Historical Provider, MD  metoprolol (LOPRESSOR) 50 MG tablet Take 50 mg by mouth 2 (two) times daily.    Historical Provider, MD  Multiple Vitamins-Minerals (CENTRUM SILVER PO) Take 1 capsule by mouth daily.     Historical Provider, MD  sevelamer carbonate (RENVELA) 800 MG tablet Take 1,600 mg by mouth daily.     Historical Provider, MD   BP 117/53 mmHg  Pulse 67  Temp(Src) 99.2 F (37.3 C) (Oral)  Resp 20  SpO2 90% Physical Exam  Constitutional: He is oriented to person,  place, and time. He appears well-developed and well-nourished. No distress.  HENT:  Head: Normocephalic and atraumatic.  Mouth/Throat: Oropharynx is clear and moist.  Eyes: Pupils are equal, round, and reactive to light.  Neck: Normal range of motion. Neck supple.  Cardiovascular: Normal rate, regular rhythm and normal heart sounds.  Exam reveals no gallop and no friction rub.   No murmur heard. Pulmonary/Chest: Effort normal and breath sounds normal. No respiratory distress.  Abdominal: Soft. Bowel sounds are normal. He exhibits no distension. There is no tenderness.  Musculoskeletal: He exhibits no edema.  Neurological: He is alert and oriented to person, place,  and time. He exhibits normal muscle tone. Coordination normal.  Skin: Skin is warm and dry. No rash noted. No erythema. No pallor.  Psychiatric: He has a normal mood and affect. His behavior is normal.  Nursing note and vitals reviewed.   ED Course  Procedures (including critical care time) Labs Review Labs Reviewed  COMPREHENSIVE METABOLIC PANEL - Abnormal; Notable for the following:    Sodium 135 (*)    Potassium 5.4 (*)    Chloride 89 (*)    Glucose, Bld 221 (*)    BUN 59 (*)    Creatinine, Ser 9.66 (*)    Total Bilirubin 4.9 (*)    GFR calc non Af Amer 5 (*)    GFR calc Af Amer 6 (*)    Anion gap 19 (*)    All other components within normal limits  CBC WITH DIFFERENTIAL - Abnormal; Notable for the following:    RDW 17.8 (*)    Platelets 97 (*)    Neutrophils Relative % 80 (*)    Neutro Abs 8.0 (*)    Lymphocytes Relative 10 (*)    All other components within normal limits  GI PATHOGEN PANEL BY PCR, STOOL    Imaging Review Dg Chest 2 View  08/16/2014   CLINICAL DATA:  Diarrhea.  Weakness.  Shortness of breath.  EXAM: CHEST  2 VIEW  COMPARISON:  06/05/2014  FINDINGS: AICD noted with moderate enlargement of the cardiopericardial silhouette. No edema. Mild cephalization of blood flow.  Mild blunting of both posterior costophrenic angles.  IMPRESSION: 1. Cardiomegaly with mild cephalization of blood flow which may reflect pulmonary venous hypertension. No overt edema. 2. Small bilateral pleural effusions.   Electronically Signed   By: Sherryl Barters M.D.   On: 08/16/2014 14:06     EKG Interpretation None     Patient is advised follow-up with his primary care doctor.  We did order stool samples but he was not able to produce a specimen.  The patient is not had diarrhea here in the emergency department.  He has been stable and all questions were answered and he voiced an understanding of the plan.  The patient is advised to increase his fluid intake and rest as much as  possible.  I advised him to make sure that he does go to dialysis tomorrow MDM   Final diagnoses:  Diarrhea in adult patient  C. difficile colitis      Brent General, PA-C 08/19/14 1521  Blanchie Dessert, MD 08/26/14 [phone]

## 2014-08-16 NOTE — ED Notes (Signed)
Patient transported to X-ray 

## 2014-08-16 NOTE — ED Notes (Signed)
Pt remains monitored by blood pressure and pulse ox. pts family remains at bedside.  

## 2014-08-16 NOTE — ED Notes (Addendum)
Pt reports having fatigue, lack of appetite and diarrhea since Tuesday, missed dialysis on Thursday due to not feeling well. Airway intact.

## 2014-08-16 NOTE — Discharge Instructions (Signed)
Return here as needed. Follow up with your doctor.   Call your Dr. Monday for Lab report on your stool sample.  If your C. Difficile result is negative at that time, you may stop the Flagyl.

## 2014-08-17 LAB — CLOSTRIDIUM DIFFICILE BY PCR: Toxigenic C. Difficile by PCR: NEGATIVE

## 2014-08-19 LAB — GI PATHOGEN PANEL BY PCR, STOOL
C difficile toxin A/B: NEGATIVE
Campylobacter by PCR: NEGATIVE
Cryptosporidium by PCR: NEGATIVE
E coli (ETEC) LT/ST: NEGATIVE
E coli (STEC): NEGATIVE
E coli 0157 by PCR: NEGATIVE
G lamblia by PCR: NEGATIVE
Norovirus GI/GII: NEGATIVE
Rotavirus A by PCR: NEGATIVE
Salmonella by PCR: NEGATIVE
Shigella by PCR: NEGATIVE

## 2014-08-21 ENCOUNTER — Inpatient Hospital Stay (HOSPITAL_COMMUNITY)
Admission: EM | Admit: 2014-08-21 | Discharge: 2014-08-25 | DRG: 291 | Disposition: A | Discharge status: Home / Self Care | Payer: Medicare Other | Attending: Internal Medicine | Admitting: Internal Medicine

## 2014-08-21 ENCOUNTER — Inpatient Hospital Stay (HOSPITAL_COMMUNITY): Payer: Medicare Other

## 2014-08-21 ENCOUNTER — Emergency Department (HOSPITAL_COMMUNITY): Payer: Medicare Other

## 2014-08-21 ENCOUNTER — Encounter (HOSPITAL_COMMUNITY): Payer: Self-pay | Admitting: Cardiology

## 2014-08-21 DIAGNOSIS — I120 Hypertensive chronic kidney disease with stage 5 chronic kidney disease or end stage renal disease: Secondary | ICD-10-CM | POA: Diagnosis present

## 2014-08-21 DIAGNOSIS — J9601 Acute respiratory failure with hypoxia: Secondary | ICD-10-CM | POA: Diagnosis present

## 2014-08-21 DIAGNOSIS — Z992 Dependence on renal dialysis: Secondary | ICD-10-CM

## 2014-08-21 DIAGNOSIS — Z794 Long term (current) use of insulin: Secondary | ICD-10-CM | POA: Diagnosis not present

## 2014-08-21 DIAGNOSIS — D649 Anemia, unspecified: Secondary | ICD-10-CM | POA: Diagnosis present

## 2014-08-21 DIAGNOSIS — Z7982 Long term (current) use of aspirin: Secondary | ICD-10-CM | POA: Diagnosis not present

## 2014-08-21 DIAGNOSIS — R197 Diarrhea, unspecified: Secondary | ICD-10-CM | POA: Diagnosis present

## 2014-08-21 DIAGNOSIS — I5042 Chronic combined systolic (congestive) and diastolic (congestive) heart failure: Principal | ICD-10-CM | POA: Diagnosis present

## 2014-08-21 DIAGNOSIS — Z833 Family history of diabetes mellitus: Secondary | ICD-10-CM

## 2014-08-21 DIAGNOSIS — I5022 Chronic systolic (congestive) heart failure: Secondary | ICD-10-CM | POA: Diagnosis present

## 2014-08-21 DIAGNOSIS — I509 Heart failure, unspecified: Secondary | ICD-10-CM | POA: Insufficient documentation

## 2014-08-21 DIAGNOSIS — Z888 Allergy status to other drugs, medicaments and biological substances status: Secondary | ICD-10-CM | POA: Diagnosis not present

## 2014-08-21 DIAGNOSIS — E861 Hypovolemia: Secondary | ICD-10-CM | POA: Diagnosis present

## 2014-08-21 DIAGNOSIS — I5033 Acute on chronic diastolic (congestive) heart failure: Secondary | ICD-10-CM

## 2014-08-21 DIAGNOSIS — Z8249 Family history of ischemic heart disease and other diseases of the circulatory system: Secondary | ICD-10-CM

## 2014-08-21 DIAGNOSIS — R7881 Bacteremia: Secondary | ICD-10-CM | POA: Diagnosis present

## 2014-08-21 DIAGNOSIS — R0682 Tachypnea, not elsewhere classified: Secondary | ICD-10-CM

## 2014-08-21 DIAGNOSIS — Z95810 Presence of automatic (implantable) cardiac defibrillator: Secondary | ICD-10-CM | POA: Diagnosis not present

## 2014-08-21 DIAGNOSIS — E119 Type 2 diabetes mellitus without complications: Secondary | ICD-10-CM | POA: Diagnosis present

## 2014-08-21 DIAGNOSIS — R5383 Other fatigue: Secondary | ICD-10-CM | POA: Diagnosis present

## 2014-08-21 DIAGNOSIS — N186 End stage renal disease: Secondary | ICD-10-CM | POA: Diagnosis present

## 2014-08-21 DIAGNOSIS — J9600 Acute respiratory failure, unspecified whether with hypoxia or hypercapnia: Secondary | ICD-10-CM | POA: Diagnosis present

## 2014-08-21 DIAGNOSIS — R0602 Shortness of breath: Secondary | ICD-10-CM

## 2014-08-21 DIAGNOSIS — R0902 Hypoxemia: Secondary | ICD-10-CM

## 2014-08-21 DIAGNOSIS — R531 Weakness: Secondary | ICD-10-CM

## 2014-08-21 DIAGNOSIS — I10 Essential (primary) hypertension: Secondary | ICD-10-CM

## 2014-08-21 HISTORY — DX: Dependence on renal dialysis: Z99.2

## 2014-08-21 HISTORY — DX: Presence of automatic (implantable) cardiac defibrillator: Z95.810

## 2014-08-21 HISTORY — DX: Type 2 diabetes mellitus without complications: E11.9

## 2014-08-21 HISTORY — DX: End stage renal disease: N18.6

## 2014-08-21 HISTORY — DX: Pneumonia, unspecified organism: J18.9

## 2014-08-21 HISTORY — DX: Heart failure, unspecified: I50.9

## 2014-08-21 LAB — COMPREHENSIVE METABOLIC PANEL
ALK PHOS: 77 U/L (ref 39–117)
ALT: 14 U/L (ref 0–53)
AST: 32 U/L (ref 0–37)
Albumin: 3.1 g/dL — ABNORMAL LOW (ref 3.5–5.2)
Anion gap: 19 — ABNORMAL HIGH (ref 5–15)
BILIRUBIN TOTAL: 3.3 mg/dL — AB (ref 0.3–1.2)
BUN: 43 mg/dL — ABNORMAL HIGH (ref 6–23)
CO2: 28 meq/L (ref 19–32)
Calcium: 9.8 mg/dL (ref 8.4–10.5)
Chloride: 88 mEq/L — ABNORMAL LOW (ref 96–112)
Creatinine, Ser: 8.11 mg/dL — ABNORMAL HIGH (ref 0.50–1.35)
GFR calc Af Amer: 7 mL/min — ABNORMAL LOW (ref >90)
GFR calc non Af Amer: 6 mL/min — ABNORMAL LOW (ref >90)
Glucose, Bld: 217 mg/dL — ABNORMAL HIGH (ref 70–99)
POTASSIUM: 5 meq/L (ref 3.7–5.3)
SODIUM: 135 meq/L — AB (ref 137–147)
Total Protein: 7.9 g/dL (ref 6.0–8.3)

## 2014-08-21 LAB — HEPATITIS B SURFACE ANTIBODY,QUALITATIVE: Hep B S Ab: POSITIVE — AB

## 2014-08-21 LAB — CBC WITH DIFFERENTIAL/PLATELET
BASOS PCT: 2 % — AB (ref 0–1)
Basophils Absolute: 0.2 10*3/uL — ABNORMAL HIGH (ref 0.0–0.1)
EOS PCT: 0 % (ref 0–5)
Eosinophils Absolute: 0 10*3/uL (ref 0.0–0.7)
HCT: 38.5 % — ABNORMAL LOW (ref 39.0–52.0)
HEMOGLOBIN: 13.7 g/dL (ref 13.0–17.0)
Lymphocytes Relative: 12 % (ref 12–46)
Lymphs Abs: 1.2 10*3/uL (ref 0.7–4.0)
MCH: 33.2 pg (ref 26.0–34.0)
MCHC: 35.6 g/dL (ref 30.0–36.0)
MCV: 93.2 fL (ref 78.0–100.0)
MONO ABS: 1.4 10*3/uL — AB (ref 0.1–1.0)
MONOS PCT: 14 % — AB (ref 3–12)
NEUTROS PCT: 72 % (ref 43–77)
Neutro Abs: 7.2 10*3/uL (ref 1.7–7.7)
Platelets: 169 10*3/uL (ref 150–400)
RBC: 4.13 MIL/uL — ABNORMAL LOW (ref 4.22–5.81)
RDW: 16.8 % — ABNORMAL HIGH (ref 11.5–15.5)
WBC: 10 10*3/uL (ref 4.0–10.5)

## 2014-08-21 LAB — HEPATITIS B SURFACE ANTIGEN: Hepatitis B Surface Ag: NEGATIVE

## 2014-08-21 LAB — PRO B NATRIURETIC PEPTIDE: Pro B Natriuretic peptide (BNP): >70000 pg/mL — ABNORMAL HIGH (ref 0–125)

## 2014-08-21 LAB — GLUCOSE, CAPILLARY: GLUCOSE-CAPILLARY: 168 mg/dL — AB (ref 70–99)

## 2014-08-21 LAB — HEPATITIS B CORE ANTIBODY, TOTAL: HEP B C TOTAL AB: NONREACTIVE

## 2014-08-21 MED ORDER — HEPARIN SODIUM (PORCINE) 1000 UNIT/ML DIALYSIS
1000.0000 [IU] | INTRAMUSCULAR | Status: DC | PRN
Start: 2014-08-21 — End: 2014-08-21

## 2014-08-21 MED ORDER — ACETAMINOPHEN 650 MG RE SUPP: 650.0000 mg | Freq: Four times a day (QID) | RECTAL | Status: DC | PRN

## 2014-08-21 MED ORDER — ONDANSETRON HCL 4 MG PO TABS: 4.0000 mg | ORAL_TABLET | Freq: Four times a day (QID) | ORAL | Status: DC | PRN

## 2014-08-21 MED ORDER — HYDROMORPHONE HCL 1 MG/ML IJ SOLN: 1.0000 mg | INTRAMUSCULAR | Status: DC | PRN

## 2014-08-21 MED ORDER — SODIUM CHLORIDE 0.9 % IV SOLN: 100.0000 mL | INTRAVENOUS | Status: DC | PRN

## 2014-08-21 MED ORDER — LIDOCAINE HCL (PF) 1 % IJ SOLN: 5.0000 mL | INTRAMUSCULAR | Status: DC | PRN

## 2014-08-21 MED ORDER — ASPIRIN 81 MG PO CHEW
81.0000 mg | CHEWABLE_TABLET | Freq: Every day | ORAL | Status: DC
  Administered 2014-08-21 – 2014-08-25 (×5): 81 mg via ORAL
  Filled 2014-08-21 (×6): qty 1

## 2014-08-21 MED ORDER — METRONIDAZOLE IN NACL 5-0.79 MG/ML-% IV SOLN
500.0000 mg | Freq: Three times a day (TID) | INTRAVENOUS | Status: DC
  Administered 2014-08-21 – 2014-08-23 (×4): 500 mg via INTRAVENOUS
  Filled 2014-08-21 (×6): qty 100

## 2014-08-21 MED ORDER — LEVALBUTEROL HCL 0.63 MG/3ML IN NEBU
0.6300 mg | INHALATION_SOLUTION | Freq: Four times a day (QID) | RESPIRATORY_TRACT | Status: DC
  Administered 2014-08-21: 0.63 mg via RESPIRATORY_TRACT
  Filled 2014-08-21: qty 3

## 2014-08-21 MED ORDER — METOPROLOL TARTRATE 50 MG PO TABS
50.0000 mg | ORAL_TABLET | Freq: Two times a day (BID) | ORAL | Status: DC
  Administered 2014-08-21 – 2014-08-25 (×5): 50 mg via ORAL
  Filled 2014-08-21 (×9): qty 1

## 2014-08-21 MED ORDER — SEVELAMER CARBONATE 800 MG PO TABS
1600.0000 mg | ORAL_TABLET | Freq: Every day | ORAL | Status: DC
  Administered 2014-08-21 – 2014-08-25 (×4): 1600 mg via ORAL
  Filled 2014-08-21 (×5): qty 2

## 2014-08-21 MED ORDER — LIDOCAINE-PRILOCAINE 2.5-2.5 % EX CREA
1.0000 | TOPICAL_CREAM | CUTANEOUS | Status: DC | PRN
Start: 2014-08-21 — End: 2014-08-21

## 2014-08-21 MED ORDER — ENOXAPARIN SODIUM 30 MG/0.3ML ~~LOC~~ SOLN
30.0000 mg | SUBCUTANEOUS | Status: DC
  Administered 2014-08-21 – 2014-08-24 (×4): 30 mg via SUBCUTANEOUS
  Filled 2014-08-21 (×5): qty 0.3

## 2014-08-21 MED ORDER — ONDANSETRON HCL 4 MG/2ML IJ SOLN: 4.0000 mg | Freq: Four times a day (QID) | INTRAMUSCULAR | Status: DC | PRN

## 2014-08-21 MED ORDER — NEPRO/CARBSTEADY PO LIQD: 237.0000 mL | ORAL | Status: DC | PRN

## 2014-08-21 MED ORDER — PENTAFLUOROPROP-TETRAFLUOROETH EX AERO: 1.0000 "application " | INHALATION_SPRAY | CUTANEOUS | Status: DC | PRN

## 2014-08-21 MED ORDER — FERROUS SULFATE 325 (65 FE) MG PO TABS
325.0000 mg | ORAL_TABLET | Freq: Two times a day (BID) | ORAL | Status: DC
  Administered 2014-08-21 – 2014-08-25 (×8): 325 mg via ORAL
  Filled 2014-08-21 (×9): qty 1

## 2014-08-21 MED ORDER — LEVALBUTEROL HCL 0.63 MG/3ML IN NEBU
0.6300 mg | INHALATION_SOLUTION | Freq: Two times a day (BID) | RESPIRATORY_TRACT | Status: DC
  Administered 2014-08-22 – 2014-08-23 (×3): 0.63 mg via RESPIRATORY_TRACT
  Filled 2014-08-21 (×7): qty 3

## 2014-08-21 MED ORDER — ACETAMINOPHEN 325 MG PO TABS
650.0000 mg | ORAL_TABLET | Freq: Four times a day (QID) | ORAL | Status: DC | PRN
  Administered 2014-08-21 – 2014-08-22 (×3): 650 mg via ORAL
  Filled 2014-08-21 (×3): qty 2

## 2014-08-21 MED ORDER — CETYLPYRIDINIUM CHLORIDE 0.05 % MT LIQD
7.0000 mL | Freq: Two times a day (BID) | OROMUCOSAL | Status: DC
  Administered 2014-08-21 – 2014-08-25 (×4): 7 mL via OROMUCOSAL

## 2014-08-21 MED ORDER — HYDROCODONE-ACETAMINOPHEN 5-325 MG PO TABS: 1.0000 | ORAL_TABLET | ORAL | Status: DC | PRN

## 2014-08-21 MED ORDER — ALTEPLASE 2 MG IJ SOLR: 2.0000 mg | Freq: Once | INTRAMUSCULAR | Status: DC | PRN

## 2014-08-21 MED ORDER — SODIUM CHLORIDE 0.9 % IJ SOLN
3.0000 mL | Freq: Two times a day (BID) | INTRAMUSCULAR | Status: DC
  Administered 2014-08-21 – 2014-08-25 (×7): 3 mL via INTRAVENOUS

## 2014-08-21 NOTE — Consult Note (Signed)
Referring Provider: No ref. provider found Primary Care Physician:   Melinda Crutch, MD Primary Nephrologist:  Dr. Candiss Norse  Reason for Consultation:  Shortness of breath, lower extremity edema and end stage renal disease  HPI: TTS dialysis patient Holiday schedule was supposed to have a dialysis treatment today but came to the ER with shortness of breath and hypoxia. No history of chest pain. Some difficulty in reaching dry weight according to the dialysis nurse and has no history of coronary artery disease although does have a history of defibrillator placement Chronic lower extremity edema   Past Medical History  Diagnosis Date  . End stage renal disease     on dialysis in [place]  . Cardiac defibrillator in place 2011    for syncope with FH of sudden death  . Diabetes mellitus   . Erectile dysfunction   . HTN (hypertension)   . Cardiovascular collapse 2011    Past Surgical History  Procedure Laterality Date  . Ganglion cyst excision    . Cataracts Bilateral 2007  . Cardiac defibrillator placement  2011    [employer], implanted in [place] for syncope with FH of sudden death  . Shunt externalization  2006    Prior to Admission medications   Medication Sig Start Date End Date Taking? Authorizing Provider  aspirin 81 MG tablet Take 81 mg by mouth daily.   Yes Historical Provider, MD  B Complex-C-Folic Acid (NEPHROCAPS PO) Take 1 capsule by mouth daily.   Yes Historical Provider, MD  ferrous sulfate 325 (65 FE) MG tablet Take 325 mg by mouth 2 (two) times daily.   Yes Historical Provider, MD  insulin aspart (NOVOLOG) 100 UNIT/ML injection Inject 2 Units into the skin daily after supper.    Yes Historical Provider, MD  loperamide (IMODIUM) 2 MG capsule Take 1 capsule (2 mg total) by mouth 4 (four) times daily as needed for diarrhea or loose stools. 08/16/14  Yes Resa Miner Lawyer, PA-C  metoprolol (LOPRESSOR) 50 MG tablet Take 50 mg by mouth 2 (two) times daily.   Yes  Historical Provider, MD  metroNIDAZOLE (FLAGYL) 500 MG tablet Take 1 tablet (500 mg total) by mouth 3 (three) times daily. 08/16/14 08/23/14 Yes Tanna Furry, MD  insulin regular (NOVOLIN R,HUMULIN R) 100 units/mL injection Inject 2 Units into the skin daily after supper.    Historical Provider, MD  Multiple Vitamins-Minerals (CENTRUM SILVER PO) Take 1 capsule by mouth daily.     Historical Provider, MD  sevelamer carbonate (RENVELA) 800 MG tablet Take 1,600 mg by mouth daily.     Historical Provider, MD    No current facility-administered medications for this encounter.   Current Outpatient Prescriptions  Medication Sig Dispense Refill  . aspirin 81 MG tablet Take 81 mg by mouth daily.    . B Complex-C-Folic Acid (NEPHROCAPS PO) Take 1 capsule by mouth daily.    . ferrous sulfate 325 (65 FE) MG tablet Take 325 mg by mouth 2 (two) times daily.    . insulin aspart (NOVOLOG) 100 UNIT/ML injection Inject 2 Units into the skin daily after supper.     . loperamide (IMODIUM) 2 MG capsule Take 1 capsule (2 mg total) by mouth 4 (four) times daily as needed for diarrhea or loose stools. 12 capsule 0  . metoprolol (LOPRESSOR) 50 MG tablet Take 50 mg by mouth 2 (two) times daily.    . metroNIDAZOLE (FLAGYL) 500 MG tablet Take 1 tablet (500 mg total) by mouth 3 (  three) times daily. 21 tablet 0  . insulin regular (NOVOLIN R,HUMULIN R) 100 units/mL injection Inject 2 Units into the skin daily after supper.    . Multiple Vitamins-Minerals (CENTRUM SILVER PO) Take 1 capsule by mouth daily.     . sevelamer carbonate (RENVELA) 800 MG tablet Take 1,600 mg by mouth daily.       Allergies as of 08/21/2014 - Review Complete 08/21/2014  Allergen Reaction Noted  . Statins Cough 07/06/2013    Family History  Problem Relation Age of Onset  . Diabetes Father     deceased  . Hypertension Father     deceased  . Diabetes Mother     deceased  . Hypertension Mother     deceased  . Coronary artery disease Mother      deceased  . Coronary artery disease Maternal Grandmother   . Diabetes Maternal Grandmother     History   Social History  . Marital Status: Married    Spouse Name: N/A    Number of Children: 3  . Years of Education: N/A   Occupational History  . retired     Automotive engineer   Social History Main Topics  . Smoking status: Never Smoker   . Smokeless tobacco: Not on file  . Alcohol Use: No  . Drug Use: No  . Sexual Activity: Not on file   Other Topics Concern  . Not on file   Social History Narrative    Review of Systems: Gen: Denies any fever, chills, sweats, HEENT: No visual complaints, No history of Retinopathy. Normal external appearance No Epistaxis or Sore throat. No sinusitis.   CV: Denies chest pain, angina, palpitations, syncope, orthopnea, PND,+ chronic peripheral edema. Resp: Dyspnea at rest and to conversation GI: Denies vomiting blood, jaundice, and fecal incontinence.   Denies dysphagia or odynophagia. GU : ESRD  MS: Denies joint pain, limitation of movement, and swelling, stiffness, low back pain, extremity pain. Denies muscle weakness, cramps, atrophy.  No use of non steroidal antiinflammatory drugs. Derm: Denies rash, itching, dry skin, hives, moles, warts, or unhealing ulcers.  Psych: Denies depression, anxiety, memory loss, suicidal ideation, hallucinations, paranoia, and confusion. Heme: Denies bruising, bleeding, and enlarged lymph nodes. Neuro: No headache.  No diplopia. No dysarthria.  No dysphasia.  No history of CVA.  No Seizures. No paresthesias.  No weakness. Endocrine + Diabetes  No Thyroid disease.  No Adrenal disease.  Physical Exam: Vital signs in last 24 hours: Temp:  [98.6 F (37 C)] 98.6 F (37 C) (11/25 0904) Pulse Rate:  [63-91] 79 (11/25 1130) Resp:  [24-33] 33 (11/25 1115) BP: (113-135)/(53-106) 113/67 mmHg (11/25 1130) SpO2:  [85 %-98 %] 92 % (11/25 1130)   General:  Chronically ill appearing Head:  Normocephalic  and atraumatic. Eyes:  Sclera clear, no icterus.   Conjunctiva pink. Ears:  Normal auditory acuity. Nose:  No deformity, discharge,  or lesions. Mouth:  No deformity or lesions, dentition normal. Neck:  Supple; no masses or thyromegaly. JVP elevated Lungs:  Diminished bilateral lung fields Heart:  Regular rate and rhythm; no murmurs, clicks, rubs,  or gallops. Abdomen:  Soft, nontender and nondistended. No masses, hepatosplenomegaly or hernias noted. Normal bowel sounds, without guarding, and without rebound.   Msk:  Symmetrical without gross deformities. Normal posture. Pulses:  No carotid, renal, femoral bruits. DP and PT symmetrical and equal Extremities:  Chronic edema Neurologic: Lethargy  Skin:  Intact without significant lesions or rashes. Cervical Nodes:  No significant  cervical adenopathy.   Intake/Output from previous day:   Intake/Output this shift:    Lab Results:  Recent Labs  08/21/14 0930  WBC 10.0  HGB 13.7  HCT 38.5*  PLT 169   BMET  Recent Labs  08/21/14 0930  NA 135*  K 5.0  CL 88*  CO2 28  GLUCOSE 217*  BUN 43*  CREATININE 8.11*  CALCIUM 9.8   LFT  Recent Labs  08/21/14 0930  PROT 7.9  ALBUMIN 3.1*  AST 32  ALT 14  ALKPHOS 77  BILITOT 3.3*   PT/INR No results for input(s): LABPROT, INR in the last 72 hours. Hepatitis Panel No results for input(s): HEPBSAG, HCVAB, HEPAIGM, HEPBIGM in the last 72 hours.  Studies/Results: Dg Chest 2 View  08/21/2014   CLINICAL DATA:  Shortness of breath and weakness  EXAM: CHEST  2 VIEW  COMPARISON:  08/16/2014  FINDINGS: Cardiac shadow remains enlarged. A defibrillator is again seen. The cephalization seen previously has resolved. No focal infiltrate or sizable effusion is seen. No acute bony abnormality is noted.  IMPRESSION: Stable cardiomegaly.  No acute abnormality seen.   Electronically Signed   By: Inez Catalina M.D.   On: 08/21/2014 10:28    Assessment/Plan:  ESRD- TTS  Planned for  dialysis today  ANEMIA- controlled  MBD- check phosphorus  HTN/VOL- overload no sign of infection  ACCESS-thigh graft    LOS: 0 Malachi Kinzler W @TODAY @12 :40 PM

## 2014-08-21 NOTE — ED Provider Notes (Signed)
CSN: [id number]     Arrival date & time 08/21/14  6578 History   First MD Initiated Contact with Patient 08/21/14 [phone]     Chief Complaint  Patient presents with  . Fatigue     (Consider location/radiation/quality/duration/timing/severity/associated sxs/prior Treatment) HPI  This is a 73 year old male with a past medical history of end-stage renal disease, diabetes, hypertension, history of cardiovascular collapse, status post cardiac defibrillator placement with diastolic heart failure and last EF measured at 40%. The patient is brought in by his wife who states that he has been lethargic for the past week. The patient denies any weakness, lethargy, confusion, shortness of breath, dyspnea on exertion. However, the patient is clearly tachypneic at rest. The patient was diagnosed with pneumonia over the summer. His wife states they have not slept together for several months. She states that that was because he is coughing and he was unable to sleep well.  The patient was last dialyzed 2 days ago on Monday. He was supposed to go for dialysis this morning. However, his wife made him come to the emergency department today for evaluation. The patient denies orthopnea, PND, however, his wife states that she has seen him sitting upright on the bed at night and she is unsure if it's because he is not breathing well. Patient states he does make urine. Patient endorses peripheral edema, which is "worse than normal." Denies fevers, chills, myalgias, arthralgias.  Denies dysuria, flank pain, suprapubic pain, frequency, urgency, or hematuria. Denies headaches, light headedness, weakness, visual disturbances. Denies abdominal pain, nausea, vomiting, diarrhea or constipation.  PCP: dr. Melinda Crutch at [facility].  Past Medical History  Diagnosis Date  . End stage renal disease     on dialysis in [place]  . Cardiac defibrillator in place 2011    for syncope with FH of sudden death  . Diabetes mellitus   .  Erectile dysfunction   . HTN (hypertension)   . Cardiovascular collapse 2011   Past Surgical History  Procedure Laterality Date  . Ganglion cyst excision    . Cataracts Bilateral 2007  . Cardiac defibrillator placement  2011    [employer], implanted in [place] for syncope with FH of sudden death  . Shunt externalization  2006   Family History  Problem Relation Age of Onset  . Diabetes Father     deceased  . Hypertension Father     deceased  . Diabetes Mother     deceased  . Hypertension Mother     deceased  . Coronary artery disease Mother     deceased  . Coronary artery disease Maternal Grandmother   . Diabetes Maternal Grandmother    History  Substance Use Topics  . Smoking status: Never Smoker   . Smokeless tobacco: Not on file  . Alcohol Use: No    Review of Systems  Ten systems reviewed and are negative for acute change, except as noted in the HPI.    Allergies  Statins  Home Medications   Prior to Admission medications   Medication Sig Start Date End Date Taking? Authorizing Provider  aspirin 81 MG tablet Take 81 mg by mouth daily.   Yes Historical Provider, MD  B Complex-C-Folic Acid (NEPHROCAPS PO) Take 1 capsule by mouth daily.   Yes Historical Provider, MD  ferrous sulfate 325 (65 FE) MG tablet Take 325 mg by mouth 2 (two) times daily.   Yes Historical Provider, MD  insulin aspart (NOVOLOG) 100 UNIT/ML injection Inject 2 Units  into the skin daily after supper.    Yes Historical Provider, MD  loperamide (IMODIUM) 2 MG capsule Take 1 capsule (2 mg total) by mouth 4 (four) times daily as needed for diarrhea or loose stools. 08/16/14  Yes Resa Miner Lawyer, PA-C  metoprolol (LOPRESSOR) 50 MG tablet Take 50 mg by mouth 2 (two) times daily.   Yes Historical Provider, MD  metroNIDAZOLE (FLAGYL) 500 MG tablet Take 1 tablet (500 mg total) by mouth 3 (three) times daily. 08/16/14 08/23/14 Yes Tanna Furry, MD  insulin regular (NOVOLIN R,HUMULIN R) 100  units/mL injection Inject 2 Units into the skin daily after supper.    Historical Provider, MD  Multiple Vitamins-Minerals (CENTRUM SILVER PO) Take 1 capsule by mouth daily.     Historical Provider, MD  sevelamer carbonate (RENVELA) 800 MG tablet Take 1,600 mg by mouth daily.     Historical Provider, MD   BP 117/55 mmHg  Pulse 91  Temp(Src) 98.6 F (37 C) (Oral)  SpO2 92% Physical Exam  Constitutional: He is oriented to person, place, and time.  Appears chronically ill  HENT:  Head: Normocephalic and atraumatic.  Neck: JVD (significant JVD) present.  Cardiovascular:  4+ pitting EDEMA  Pulmonary/Chest: He has rales.  Shallow effort, tachypnea, no crackles or rales.  Abdominal: He exhibits distension. He exhibits no mass. There is no tenderness. There is no rebound and no guarding.  Musculoskeletal: Normal range of motion. He exhibits edema.  Neurological: He is alert and oriented to person, place, and time.  Skin: Skin is warm and dry.  Nursing note and vitals reviewed.   ED Course  Procedures (including critical care time) Labs Review Labs Reviewed  CBC WITH DIFFERENTIAL - Abnormal; Notable for the following:    RBC 4.13 (*)    HCT 38.5 (*)    RDW 16.8 (*)    All other components within normal limits  URINE CULTURE  COMPREHENSIVE METABOLIC PANEL  URINALYSIS, ROUTINE W REFLEX MICROSCOPIC  PRO B NATRIURETIC PEPTIDE    Imaging Review No results found.   EKG Interpretation   Date/Time:  Wednesday August 21 2014 09:01:16 EST Ventricular Rate:  92 PR Interval:  197 QRS Duration: 109 QT Interval:  406 QTC Calculation: 502 R Axis:   126 Text Interpretation:  Sinus rhythm RSR' in V1 or V2, probably normal  variant Inferior infarct, old Consider anterior infarct Prolonged QT  interval Baseline wander in lead(s) V4 No old tracing to compare Confirmed  by Debby Freiberg [phone]) on 08/21/2014 9:14:50 AM      MDM   Final diagnoses:  Hypoxia  SOB (shortness of  breath)    Patient is here with hypoxia, tachypnea, apparent volume overload. He did not go to dialysis this morning. Labs are pending. This appears consistent with CHF exacerbation. The patient has no complaints today. However, his physical exam is concerning.  11:04 AM BP 130/106 mmHg  Pulse 69  Temp(Src) 98.6 F (37 C) (Oral)  Resp 24  SpO2 98%  Patient appears to have chf secondary to volume overlosd. His Potassium is wnl. Patient is hypoxic, tachypneic and therefor is not stable to by discharged for OP dialysis. I have placed call to nephrology  Patient admitted by hospitalist. The patient will receive dialysis emergently.  he is stable for admisison. EKG  Is without signs of acute ischemia  Margarita Mail, PA-C 08/25/14 1704  Debby Freiberg, MD 08/27/14 [phone]

## 2014-08-21 NOTE — ED Notes (Signed)
Admitting MD at the bedside.  

## 2014-08-21 NOTE — ED Notes (Signed)
Pt to department via EMS from home- pt family reports that he has been lethargic for the past week. Reports he was dx with PNA a couple of weeks ago. Pt reports he had dialysis on Monday, and suppose to have a treatment today but unable to go due to the weakness. Pt was A&Ox4 with EMS. Pt with pacemaker and fistula in left arm. Bp-120/82 Hr-78. CBG-186 20 RAC.

## 2014-08-21 NOTE — Procedures (Signed)
I have seen and examined this patient and agree with the plan of care. Patient appears to be comfortable and tolerating dialysis. Appears to have volume overload. Ryan Campbell W 08/21/2014, 12:30 PM

## 2014-08-21 NOTE — Progress Notes (Signed)
Admission note: Late Entry  Arrival Method: Stretcher from Dialysis Mental Orientation:A&OX3 Telemetry: Box [phone] CCMD notified Assessment: See doc flowsheets Skin: Dry; Skin tear bilateral knees IV: R A/C Pain: Denies  Admission Screening: To be completed 6700 Orientation: Patient has been oriented to the unit, staff and to the room.

## 2014-08-21 NOTE — ED Notes (Signed)
Pt to dialysis.

## 2014-08-21 NOTE — H&P (Signed)
History and Physical       Hospital Admission Note Date: 08/21/2014  Patient name: Ryan Campbell Medical record number: [id number] Date of birth: 1941-08-22 Age: 73 y.o. Gender: male  PCP:  Melinda Crutch, MD    Chief Complaint:  Shortness of breath with diarrhea  HPI: Patient is a 73 year old male with ESRD on HD, TTS, diabetes, hypertension, chronic systolic and diastolic CHF, EF 38%, brought to ER by his wife for shortness of breath and diarrhea. Patient's wife provided the most history and stated that he has been lethargic for the past 1 week, diarrhea since Thursday 6 days, loose watery multiple BMs in a day. Patient reports that he was not feeling well on last Thursday on 11/20, missed his dialysis, subsequently received his dialysis on Saturday, then on Monday 11/23 due to holiday scheduling. Patient was supposed to go for dialysis today however patient's wife brought him to the ED for evaluation. She reported that he was not breathing well, lower extremity swelling worse than normal. She reported to him having chills but otherwise no fevers, dysuria, chest pain. She did not report seeing any hematochezia or melena. In ED, patient was noted to be tachypneic with respiratory rate in 30s, hypoxic, placed on O2 via nasal cannula, O2 sats improved to 92%  Review of Systems:  Constitutional: Denies fever, chills, diaphoresis, poor appetite and fatigue.  HEENT: Denies photophobia, eye pain, redness, hearing loss, ear pain, congestion, sore throat, rhinorrhea, sneezing, mouth sores, trouble swallowing, neck pain, neck stiffness and tinnitus.   Respiratory: Please see history of present illness Cardiovascular: Denies chest pain, palpitations and leg swelling.  Gastrointestinal: Denies nausea, vomiting, abdominal pain, constipation, blood in stool and abdominal distention. + diarrhea Genitourinary: Denies dysuria, urgency, frequency,  hematuria, flank pain and difficulty urinating.  Musculoskeletal: Denies myalgias, back pain, joint swelling, arthralgias and gait problem.  Skin: Denies pallor, rash and wound.  Neurological: Denies dizziness, seizures, syncope, light-headedness, numbness and headaches.  +generalized weakness Hematological: Denies adenopathy. Easy bruising, personal or family bleeding history  Psychiatric/Behavioral: Denies suicidal ideation, mood changes, confusion, nervousness, sleep disturbance and agitation  Past Medical History: Past Medical History  Diagnosis Date  . End stage renal disease     on dialysis in [place]  . Cardiac defibrillator in place 2011    for syncope with FH of sudden death  . Diabetes mellitus   . Erectile dysfunction   . HTN (hypertension)   . Cardiovascular collapse 2011   Past Surgical History  Procedure Laterality Date  . Ganglion cyst excision    . Cataracts Bilateral 2007  . Cardiac defibrillator placement  2011    [employer], implanted in [place] for syncope with FH of sudden death  . Shunt externalization  2006    Medications: Prior to Admission medications   Medication Sig Start Date End Date Taking? Authorizing Provider  aspirin 81 MG tablet Take 81 mg by mouth daily.   Yes Historical Provider, MD  B Complex-C-Folic Acid (NEPHROCAPS PO) Take 1 capsule by mouth daily.   Yes Historical Provider, MD  ferrous sulfate 325 (65 FE) MG tablet Take 325 mg by mouth 2 (two) times daily.   Yes Historical Provider, MD  insulin aspart (NOVOLOG) 100 UNIT/ML injection Inject 2 Units into the skin daily after supper.    Yes Historical Provider, MD  loperamide (IMODIUM) 2 MG capsule Take 1 capsule (2 mg total) by mouth 4 (four) times daily as needed for diarrhea or loose stools. 08/16/14  Yes Resa Miner Lawyer, PA-C  metoprolol (LOPRESSOR) 50 MG tablet Take 50 mg by mouth 2 (two) times daily.   Yes Historical Provider, MD  metroNIDAZOLE (FLAGYL) 500 MG tablet  Take 1 tablet (500 mg total) by mouth 3 (three) times daily. 08/16/14 08/23/14 Yes Tanna Furry, MD  insulin regular (NOVOLIN R,HUMULIN R) 100 units/mL injection Inject 2 Units into the skin daily after supper.    Historical Provider, MD  Multiple Vitamins-Minerals (CENTRUM SILVER PO) Take 1 capsule by mouth daily.     Historical Provider, MD  sevelamer carbonate (RENVELA) 800 MG tablet Take 1,600 mg by mouth daily.     Historical Provider, MD    Allergies:   Allergies  Allergen Reactions  . Statins Cough    Social History:  reports that he has never smoked. He does not have any smokeless tobacco history on file. He reports that he does not drink alcohol or use illicit drugs.  Family History: Family History  Problem Relation Age of Onset  . Diabetes Father     deceased  . Hypertension Father     deceased  . Diabetes Mother     deceased  . Hypertension Mother     deceased  . Coronary artery disease Mother     deceased  . Coronary artery disease Maternal Grandmother   . Diabetes Maternal Grandmother     Physical Exam: Blood pressure 113/67, pulse 79, temperature 98.6 F (37 C), temperature source Oral, resp. rate 33, SpO2 92 %. General: Alert and awake, no acute distress. HEENT: normocephalic, atraumatic, anicteric sclera, pink conjunctiva, pupils equal and reactive to light and accomodation, oropharynx clear Neck: supple, no masses or lymphadenopathy, no goiter, no bruits  Heart: Regular rate and rhythm, without murmurs, rubs or gallops. Lungs:Decreased breath sounds at the bases with bilateral extremity wheezing and rales  Abdomen: Soft, nontender, nondistended, positive bowel sounds, no masses. Extremities: No clubbing, cyanosis, 2-3 + edema with positive pedal pulses. Neuro: Grossly intact, no focal neurological deficits, strength 5/5 upper and lower extremities bilaterally Psych:Alert and awake, oriented  Skin: no rashes or lesions, warm and dry   LABS on Admission:   Basic Metabolic Panel:  Recent Labs Lab 08/16/14 1325 08/21/14 0930  NA 135* 135*  K 5.4* 5.0  CL 89* 88*  CO2 27 28  GLUCOSE 221* 217*  BUN 59* 43*  CREATININE 9.66* 8.11*  CALCIUM 9.9 9.8   Liver Function Tests:  Recent Labs Lab 08/16/14 1325 08/21/14 0930  AST 20 32  ALT 11 14  ALKPHOS 75 77  BILITOT 4.9* 3.3*  PROT 7.9 7.9  ALBUMIN 3.5 3.1*   No results for input(s): LIPASE, AMYLASE in the last 168 hours. No results for input(s): AMMONIA in the last 168 hours. CBC:  Recent Labs Lab 08/16/14 1325 08/21/14 0930  WBC 10.0 10.0  NEUTROABS 8.0* 7.2  HGB 13.2 13.7  HCT 39.8 38.5*  MCV 92.8 93.2  PLT 97* 169   Cardiac Enzymes: No results for input(s): CKTOTAL, CKMB, CKMBINDEX, TROPONINI in the last 168 hours. BNP: Invalid input(s): POCBNP CBG: No results for input(s): GLUCAP in the last 168 hours.   Radiological Exams on Admission: Dg Chest 2 View  08/21/2014   CLINICAL DATA:  Shortness of breath and weakness  EXAM: CHEST  2 VIEW  COMPARISON:  08/16/2014  FINDINGS: Cardiac shadow remains enlarged. A defibrillator is again seen. The cephalization seen previously has resolved. No focal infiltrate or sizable effusion is seen. No acute bony abnormality is  noted.  IMPRESSION: Stable cardiomegaly.  No acute abnormality seen.   Electronically Signed   By: Inez Catalina M.D.   On: 08/21/2014 10:28   Dg Chest 2 View  08/16/2014   CLINICAL DATA:  Diarrhea.  Weakness.  Shortness of breath.  EXAM: CHEST  2 VIEW  COMPARISON:  06/05/2014  FINDINGS: AICD noted with moderate enlargement of the cardiopericardial silhouette. No edema. Mild cephalization of blood flow.  Mild blunting of both posterior costophrenic angles.  IMPRESSION: 1. Cardiomegaly with mild cephalization of blood flow which may reflect pulmonary venous hypertension. No overt edema. 2. Small bilateral pleural effusions.   Electronically Signed   By: Sherryl Barters M.D.   On: 08/16/2014 14:06     Assessment/Plan Principal Problem:   Acute respiratory failure likely due to hypovolemia, acute on chronic systolic CHF, underlying ESRD on hemodialysis - We will admit to telemetry, also place on scheduled nebs, nephrology consulted for urgent hemodialysis  Active Problems: Diarrhea with generalized weakness - C. difficile was negative on 11/20, patient having multiple BMs in a day, recheck C. difficile, GI pathogen panel - Check CT abdomen and pelvis to rule out colitis, placed on IV Flagyl    Essential hypertension -Currently stable, continue metoprolol     ESRD (end stage renal disease)On HD, TTS  - Renal service consulted, will need urgent dialysis today, on holiday scheduling    Generalized weakness PT, OT evaluation   DVT prophylaxis: Lovenox   CODE STATUS: Full code   Family Communication: Admission, patients condition and plan of care including tests being ordered have been discussed with the patient and wife who indicates understanding and agree with the plan and Code Status   Further plan will depend as patient's clinical course evolves and further radiologic and laboratory data become available.   Time Spent on Admission: 55 mins  Simaya Lumadue M.D. [facility] 08/21/2014, 12:31 PM Pager: [phone]  If 7PM-7AM, please contact night-coverage [email or web address] Password [email or web address]

## 2014-08-21 NOTE — ED Notes (Signed)
Attempted Report 

## 2014-08-22 DIAGNOSIS — R531 Weakness: Secondary | ICD-10-CM

## 2014-08-22 DIAGNOSIS — J9601 Acute respiratory failure with hypoxia: Secondary | ICD-10-CM

## 2014-08-22 LAB — BASIC METABOLIC PANEL
ANION GAP: 21 — AB (ref 5–15)
BUN: 37 mg/dL — ABNORMAL HIGH (ref 6–23)
CO2: 22 mEq/L (ref 19–32)
Calcium: 9.1 mg/dL (ref 8.4–10.5)
Chloride: 90 mEq/L — ABNORMAL LOW (ref 96–112)
Creatinine, Ser: 7.31 mg/dL — ABNORMAL HIGH (ref 0.50–1.35)
GFR calc Af Amer: 8 mL/min — ABNORMAL LOW (ref >90)
GFR calc non Af Amer: 7 mL/min — ABNORMAL LOW (ref >90)
Glucose, Bld: 191 mg/dL — ABNORMAL HIGH (ref 70–99)
Potassium: 5.1 mEq/L (ref 3.7–5.3)
Sodium: 133 mEq/L — ABNORMAL LOW (ref 137–147)

## 2014-08-22 LAB — CBC
HCT: 36.5 % — ABNORMAL LOW (ref 39.0–52.0)
Hemoglobin: 12.6 g/dL — ABNORMAL LOW (ref 13.0–17.0)
MCH: 31.7 pg (ref 26.0–34.0)
MCHC: 34.5 g/dL (ref 30.0–36.0)
MCV: 91.7 fL (ref 78.0–100.0)
Platelets: 178 10*3/uL (ref 150–400)
RBC: 3.98 MIL/uL — ABNORMAL LOW (ref 4.22–5.81)
RDW: 16.7 % — ABNORMAL HIGH (ref 11.5–15.5)
WBC: 8.7 10*3/uL (ref 4.0–10.5)

## 2014-08-22 LAB — HEMOGLOBIN A1C
HEMOGLOBIN A1C: 7.3 % — AB (ref <5.7)
MEAN PLASMA GLUCOSE: 163 mg/dL — AB (ref <117)

## 2014-08-22 MED ORDER — VANCOMYCIN HCL 10 G IV SOLR
2500.0000 mg | Freq: Once | INTRAVENOUS | Status: AC
  Administered 2014-08-22: 2500 mg via INTRAVENOUS
  Filled 2014-08-22: qty 2500

## 2014-08-22 MED ORDER — VANCOMYCIN HCL IN DEXTROSE 1-5 GM/200ML-% IV SOLN
1000.0000 mg | Freq: Once | INTRAVENOUS | Status: AC
  Administered 2014-08-23: 1000 mg via INTRAVENOUS
  Filled 2014-08-22 (×2): qty 200

## 2014-08-22 MED ORDER — PIPERACILLIN-TAZOBACTAM IN DEX 2-0.25 GM/50ML IV SOLN
2.2500 g | Freq: Three times a day (TID) | INTRAVENOUS | Status: DC
  Administered 2014-08-22 – 2014-08-25 (×10): 2.25 g via INTRAVENOUS
  Filled 2014-08-22 (×12): qty 50

## 2014-08-22 NOTE — Progress Notes (Signed)
[facility] PROGRESS NOTE   Ryan Campbell DVV:[id number] DOB: 25-Mar-1941 DOA: 08/21/2014 PCP:  Melinda Crutch, MD  HPI/Subjective: Feels better, SOB and wheezing resolved.  Assessment/Plan: Principal Problem:   Acute respiratory failure Active Problems:   Essential hypertension   ESRD (end stage renal disease)   Chronic systolic heart failure   Diarrhea   Generalized weakness    Acute respiratory failure with hypoxia This is secondary to noncardiogenic pulmonary edema from missing hemodialysis. Patient admitted to telemetry, started on nebulized bronchodilators. Dialyzed yesterday, his breathing is back to his baseline, SOB and wheezing resolved. This is resolved after dialysis.  Diarrhea with generalized weakness Report multiple PMNs per day, 1 loose bowel movement since yesterday. Recently C. difficile was negative, recheck C. difficile and GI pathogen panel. CT scan of abdomen pelvis did not show colitis, patient is on IV Flagyl.  Fever Temperature up to 102.5, no marked leukocytosis or x-ray findings. Blood culture obtained, UA and urine culture will be obtained. Patient also on metronidazole, I'll start Vanco/Zosyn.  ESRD Nephrology consulted, continue dialysis. Missed dialysis once last Tuesday 11/24.  Chronic systolic CHF 2-D echocardiogram in October 2014 showed the EF of 40-45%. Current pulmonary edema and hypotension is secondary to hypervolemia from missing dialysis. Continue metoprolol and aspirin.  Code Status: Full code Family Communication: Plan discussed with the patient. Disposition Plan: Remains inpatient   Consultants:  Nephrology  Procedures:  None  Antibiotics:  Metronidazole    Objective: Filed Vitals:   08/22/14 0538  BP: 110/56  Pulse: 77  Temp: 102.5 F (39.2 C)  Resp: 16    Intake/Output Summary (Last 24 hours) at 08/22/14 1000 Last data filed at 08/22/14 0600  Gross per 24 hour  Intake    560 ml  Output    3938 ml  Net  -3378 ml   There were no vitals filed for this visit.  Exam: General: Alert and awake, oriented x3, not in any acute distress. HEENT: anicteric sclera, pupils reactive to light and accommodation, EOMI CVS: S1-S2 clear, no murmur rubs or gallops Chest: clear to auscultation bilaterally, no wheezing, rales or rhonchi Abdomen: soft nontender, nondistended, normal bowel sounds, no organomegaly Extremities: no cyanosis, clubbing or edema noted bilaterally Neuro: Cranial nerves II-XII intact, no focal neurological deficits  Data Reviewed: Basic Metabolic Panel:  Recent Labs Lab 08/16/14 1325 08/21/14 0930 08/22/14 0421  NA 135* 135* 133*  K 5.4* 5.0 5.1  CL 89* 88* 90*  CO2 27 28 22   GLUCOSE 221* 217* 191*  BUN 59* 43* 37*  CREATININE 9.66* 8.11* 7.31*  CALCIUM 9.9 9.8 9.1   Liver Function Tests:  Recent Labs Lab 08/16/14 1325 08/21/14 0930  AST 20 32  ALT 11 14  ALKPHOS 75 77  BILITOT 4.9* 3.3*  PROT 7.9 7.9  ALBUMIN 3.5 3.1*   No results for input(s): LIPASE, AMYLASE in the last 168 hours. No results for input(s): AMMONIA in the last 168 hours. CBC:  Recent Labs Lab 08/16/14 1325 08/21/14 0930 08/22/14 0421  WBC 10.0 10.0 8.7  NEUTROABS 8.0* 7.2  --   HGB 13.2 13.7 12.6*  HCT 39.8 38.5* 36.5*  MCV 92.8 93.2 91.7  PLT 97* 169 178   Cardiac Enzymes: No results for input(s): CKTOTAL, CKMB, CKMBINDEX, TROPONINI in the last 168 hours. BNP (last 3 results)  Recent Labs  08/21/14 0930  PROBNP >70000.0*   CBG:  Recent Labs Lab 08/21/14 2054  GLUCAP 168*    Micro Recent Results (from the  past 240 hour(s))  Clostridium Difficile by PCR     Status: None   Collection Time: 08/16/14  5:16 PM  Result Value Ref Range Status   C difficile by pcr NEGATIVE NEGATIVE Final     Studies: Ct Abdomen Pelvis Wo Contrast  08/22/2014   CLINICAL DATA:  Family reports patient is been lethargic for the past week. Recent diagnosis of pneumonia.  Diarrhea. Missed dialysis due to weakness.  EXAM: CT ABDOMEN AND PELVIS WITHOUT CONTRAST  TECHNIQUE: Multidetector CT imaging of the abdomen and pelvis was performed following the standard protocol without IV contrast.  COMPARISON:  03/27/2014  FINDINGS: Lower chest: There is moderate cardiac enlargement. Left chest wall AICD lead is identified within the right ventricle. Calcifications within the RCA and left circumflex coronary arteries are noted. Small bilateral pleural effusions noted.  Hepatobiliary: No focal liver abnormality. Gallbladder sludge identified. No biliary dilatation.  Pancreas: The pancreas appears normal.  Spleen: Normal appearance of the spleen.  Adrenals/Urinary Tract: Normal adrenal glands. Atrophy of the left kidney. Asymmetric right-sided nephromegaly and hydronephrosis and hydroureter noted. Increased attenuation within the dilated right ureter is noted up to the level of the urinary bladder. There is increased attenuation within the lumen of the bladder. Partially collapsed bladder exhibits mild bladder wall thickening.  Stomach/Bowel: The stomach is normal. The small bowel loops have a normal course and caliber. Unremarkable appearance of the colon.  Vascular/Lymphatic: Calcified atherosclerotic disease involves the abdominal aorta. No aneurysm. No enlarged retroperitoneal or mesenteric adenopathy. No enlarged pelvic or inguinal lymph nodes.  Reproductive: Prostate gland enlargement noted.  Other: Patient has a peritoneal dialysis catheter. The tip of the catheter is in the right lower quadrant of the abdomen. There is a moderate amount of ascites identified within the abdomen and pelvis.  Musculoskeletal: Changes of chronic renal osteodystrophy identified. Advanced degenerative disc disease noted at L4-5 and L5-S1. Facet hypertrophy and degenerative change noted bilaterally.  IMPRESSION: 1. Pleural effusions and body wall edema noted consistent with a fluid overload state perhaps due to  missing dialysis. 2. Peritoneal dialysis catheter is in place and there is a moderate amount of fluid within the abdomen and pelvis. Likely dialysate. 3. Right-sided hydronephrosis and hydroureter. Intermediate attenuating fluid within the right renal collecting system, ureter and bladder which may reflect blood clot. Underlying urothelial lesion cannot be excluded. No stone noted. Recommend followup with hematuria protocol CT when the patient is able to be dialyzed appropriately.   Electronically Signed   By: Kerby Moors M.D.   On: 08/22/2014 02:36   Dg Chest 2 View  08/21/2014   CLINICAL DATA:  Shortness of breath and weakness  EXAM: CHEST  2 VIEW  COMPARISON:  08/16/2014  FINDINGS: Cardiac shadow remains enlarged. A defibrillator is again seen. The cephalization seen previously has resolved. No focal infiltrate or sizable effusion is seen. No acute bony abnormality is noted.  IMPRESSION: Stable cardiomegaly.  No acute abnormality seen.   Electronically Signed   By: Inez Catalina M.D.   On: 08/21/2014 10:28    Scheduled Meds: . antiseptic oral rinse  7 mL Mouth Rinse BID  . aspirin  81 mg Oral Daily  . enoxaparin (LOVENOX) injection  30 mg Subcutaneous Q24H  . ferrous sulfate  325 mg Oral BID  . levalbuterol  0.63 mg Nebulization BID  . metoprolol  50 mg Oral BID  . metronidazole  500 mg Intravenous Q8H  . sevelamer carbonate  1,600 mg Oral Daily  . sodium chloride  3 mL Intravenous Q12H   Continuous Infusions:      Time spent: 35 minutes    [facility] A  [facility] Pager [phone] If 7PM-7AM, please contact night-coverage at [email or web address], password [email or web address] 08/22/2014, 10:00 AM  LOS: 1 day

## 2014-08-22 NOTE — Progress Notes (Signed)
[facility] ROUNDING NOTE   Subjective:   Interval History: improved this morning  Objective:  Vital signs in last 24 hours:  Temp:  [99.1 F (37.3 C)-102.5 F (39.2 C)] 102.5 F (39.2 C) (11/26 0538) Pulse Rate:  [63-91] 77 (11/26 0538) Resp:  [16-33] 16 (11/26 0538) BP: (107-135)/(53-106) 110/56 mmHg (11/26 0538) SpO2:  [92 %-100 %] 94 % (11/26 0538)  Weight change:  There were no vitals filed for this visit.  Intake/Output: I/O last 3 completed shifts: In: 560 [P.O.:560] Out: 3938 [Other:3938]   Intake/Output this shift:     CVS- RRR RS- CTA less rales ABD- BS present soft non-distended EXT- no edema   Basic Metabolic Panel:  Recent Labs Lab 08/16/14 1325 08/21/14 0930 08/22/14 0421  NA 135* 135* 133*  K 5.4* 5.0 5.1  CL 89* 88* 90*  CO2 27 28 22   GLUCOSE 221* 217* 191*  BUN 59* 43* 37*  CREATININE 9.66* 8.11* 7.31*  CALCIUM 9.9 9.8 9.1    Liver Function Tests:  Recent Labs Lab 08/16/14 1325 08/21/14 0930  AST 20 32  ALT 11 14  ALKPHOS 75 77  BILITOT 4.9* 3.3*  PROT 7.9 7.9  ALBUMIN 3.5 3.1*   No results for input(s): LIPASE, AMYLASE in the last 168 hours. No results for input(s): AMMONIA in the last 168 hours.  CBC:  Recent Labs Lab 08/16/14 1325 08/21/14 0930 08/22/14 0421  WBC 10.0 10.0 8.7  NEUTROABS 8.0* 7.2  --   HGB 13.2 13.7 12.6*  HCT 39.8 38.5* 36.5*  MCV 92.8 93.2 91.7  PLT 97* 169 178    Cardiac Enzymes: No results for input(s): CKTOTAL, CKMB, CKMBINDEX, TROPONINI in the last 168 hours.  BNP: Invalid input(s): POCBNP  CBG:  Recent Labs Lab 08/21/14 2054  GLUCAP 168*    Microbiology: Results for orders placed or performed during the hospital encounter of 08/16/14  Clostridium Difficile by PCR     Status: None   Collection Time: 08/16/14  5:16 PM  Result Value Ref Range Status   C difficile by pcr NEGATIVE NEGATIVE Final    Coagulation Studies: No results for input(s): LABPROT, INR in  the last 72 hours.  Urinalysis: No results for input(s): COLORURINE, LABSPEC, PHURINE, GLUCOSEU, HGBUR, BILIRUBINUR, KETONESUR, PROTEINUR, UROBILINOGEN, NITRITE, LEUKOCYTESUR in the last 72 hours.  Invalid input(s): APPERANCEUR    Imaging: Ct Abdomen Pelvis Wo Contrast  08/22/2014   CLINICAL DATA:  Family reports patient is been lethargic for the past week. Recent diagnosis of pneumonia. Diarrhea. Missed dialysis due to weakness.  EXAM: CT ABDOMEN AND PELVIS WITHOUT CONTRAST  TECHNIQUE: Multidetector CT imaging of the abdomen and pelvis was performed following the standard protocol without IV contrast.  COMPARISON:  03/27/2014  FINDINGS: Lower chest: There is moderate cardiac enlargement. Left chest wall AICD lead is identified within the right ventricle. Calcifications within the RCA and left circumflex coronary arteries are noted. Small bilateral pleural effusions noted.  Hepatobiliary: No focal liver abnormality. Gallbladder sludge identified. No biliary dilatation.  Pancreas: The pancreas appears normal.  Spleen: Normal appearance of the spleen.  Adrenals/Urinary Tract: Normal adrenal glands. Atrophy of the left kidney. Asymmetric right-sided nephromegaly and hydronephrosis and hydroureter noted. Increased attenuation within the dilated right ureter is noted up to the level of the urinary bladder. There is increased attenuation within the lumen of the bladder. Partially collapsed bladder exhibits mild bladder wall thickening.  Stomach/Bowel: The stomach is normal. The small bowel loops have a normal course and  caliber. Unremarkable appearance of the colon.  Vascular/Lymphatic: Calcified atherosclerotic disease involves the abdominal aorta. No aneurysm. No enlarged retroperitoneal or mesenteric adenopathy. No enlarged pelvic or inguinal lymph nodes.  Reproductive: Prostate gland enlargement noted.  Other: Patient has a peritoneal dialysis catheter. The tip of the catheter is in the right lower quadrant  of the abdomen. There is a moderate amount of ascites identified within the abdomen and pelvis.  Musculoskeletal: Changes of chronic renal osteodystrophy identified. Advanced degenerative disc disease noted at L4-5 and L5-S1. Facet hypertrophy and degenerative change noted bilaterally.  IMPRESSION: 1. Pleural effusions and body wall edema noted consistent with a fluid overload state perhaps due to missing dialysis. 2. Peritoneal dialysis catheter is in place and there is a moderate amount of fluid within the abdomen and pelvis. Likely dialysate. 3. Right-sided hydronephrosis and hydroureter. Intermediate attenuating fluid within the right renal collecting system, ureter and bladder which may reflect blood clot. Underlying urothelial lesion cannot be excluded. No stone noted. Recommend followup with hematuria protocol CT when the patient is able to be dialyzed appropriately.   Electronically Signed   By: Kerby Moors M.D.   On: 08/22/2014 02:36   Dg Chest 2 View  08/21/2014   CLINICAL DATA:  Shortness of breath and weakness  EXAM: CHEST  2 VIEW  COMPARISON:  08/16/2014  FINDINGS: Cardiac shadow remains enlarged. A defibrillator is again seen. The cephalization seen previously has resolved. No focal infiltrate or sizable effusion is seen. No acute bony abnormality is noted.  IMPRESSION: Stable cardiomegaly.  No acute abnormality seen.   Electronically Signed   By: Inez Catalina M.D.   On: 08/21/2014 10:28     Medications:     . antiseptic oral rinse  7 mL Mouth Rinse BID  . aspirin  81 mg Oral Daily  . enoxaparin (LOVENOX) injection  30 mg Subcutaneous Q24H  . ferrous sulfate  325 mg Oral BID  . levalbuterol  0.63 mg Nebulization BID  . metoprolol  50 mg Oral BID  . metronidazole  500 mg Intravenous Q8H  . sevelamer carbonate  1,600 mg Oral Daily  . sodium chloride  3 mL Intravenous Q12H   acetaminophen **OR** acetaminophen, HYDROcodone-acetaminophen, HYDROmorphone (DILAUDID) injection,  ondansetron **OR** ondansetron (ZOFRAN) IV  Assessment/ Plan:    ESRD- TTS Planned for dialysis tomorrow  ANEMIA- controlled Hb 12.6  MBD- check phosphorus  HTN/VOL- overload improved  ACCESS-thigh graft  LOS: 1 Ryan Campbell W @TODAY @9 :16 AM

## 2014-08-22 NOTE — Progress Notes (Signed)
Received call from [facility] stated blood culture x2 positive for gram negative rods.  Notified MD on call.  Will continue to monitor.

## 2014-08-22 NOTE — Progress Notes (Signed)
ANTIBIOTIC CONSULT NOTE - INITIAL  Pharmacy Consult for vancomycin and Zosyn Indication: empiric coverage  Allergies  Allergen Reactions  . Statins Cough    Patient Measurements: Weight: 125 kg 03/2014 Height: 70" 03/2014  Vital Signs: Temp: 102.5 F (39.2 C) (11/26 0538) Temp Source: Oral (11/26 0538) BP: 110/56 mmHg (11/26 0538) Pulse Rate: 77 (11/26 0538) Intake/Output from previous day: 11/25 0701 - 11/26 0700 In: 560 [P.O.:560] Out: 3938  Intake/Output from this shift:    Labs:  Recent Labs  08/21/14 0930 08/22/14 0421  WBC 10.0 8.7  HGB 13.7 12.6*  PLT 169 178  CREATININE 8.11* 7.31*   CrCl cannot be calculated (Unknown ideal weight.). No results for input(s): VANCOTROUGH, VANCOPEAK, VANCORANDOM, GENTTROUGH, GENTPEAK, GENTRANDOM, TOBRATROUGH, TOBRAPEAK, TOBRARND, AMIKACINPEAK, AMIKACINTROU, AMIKACIN in the last 72 hours.   Microbiology: Recent Results (from the past 720 hour(s))  Clostridium Difficile by PCR     Status: None   Collection Time: 08/16/14  5:16 PM  Result Value Ref Range Status   C difficile by pcr NEGATIVE NEGATIVE Final    Medical History: Past Medical History  Diagnosis Date  . Erectile dysfunction   . HTN (hypertension)   . Cardiovascular collapse 2011  . ESRD on dialysis     "[place]; TTS" (08/21/2014)  . AICD (automatic cardioverter/defibrillator) present 2011    for syncope with FH of sudden death  . Type II diabetes mellitus   . Pneumonia 07/2014    Archie Endo 08/21/2014  . CHF (congestive heart failure)     Medications:  Prescriptions prior to admission  Medication Sig Dispense Refill Last Dose  . aspirin 81 MG tablet Take 81 mg by mouth daily.   08/20/2014 at Unknown time  . B Complex-C-Folic Acid (NEPHROCAPS PO) Take 1 capsule by mouth daily.   08/20/2014 at Unknown time  . ferrous sulfate 325 (65 FE) MG tablet Take 325 mg by mouth 2 (two) times daily.   08/20/2014 at Unknown time  . insulin aspart (NOVOLOG) 100  UNIT/ML injection Inject 2 Units into the skin daily after supper.    08/20/2014 at Unknown time  . loperamide (IMODIUM) 2 MG capsule Take 1 capsule (2 mg total) by mouth 4 (four) times daily as needed for diarrhea or loose stools. 12 capsule 0 Past Week at Unknown time  . metoprolol (LOPRESSOR) 50 MG tablet Take 50 mg by mouth 2 (two) times daily.   Past Week at Unknown time  . metroNIDAZOLE (FLAGYL) 500 MG tablet Take 1 tablet (500 mg total) by mouth 3 (three) times daily. 21 tablet 0 08/20/2014 at Unknown time  . insulin regular (NOVOLIN R,HUMULIN R) 100 units/mL injection Inject 2 Units into the skin daily after supper.   08/15/2014 at Unknown time  . Multiple Vitamins-Minerals (CENTRUM SILVER PO) Take 1 capsule by mouth daily.    08/13/2014  . sevelamer carbonate (RENVELA) 800 MG tablet Take 1,600 mg by mouth daily.    08/13/2014   Assessment: 73 y/o male with ESRD on HD TTS admitted with lethargy, SOB and diarrhea on 11/25. Pharmacy consulted to begin vancomycin and Zosyn empirically for fever. He continues on metronidazole to r/o C diff. Tm 102.5, WBC are normal, and cultures are pending. Next HD is tomorrow.  Goal of Therapy:  Pre-HD vancomycin level 15-25 mcg/ml  Plan:  - Vancomycin 2500 mg IV now then 1000 mg IV after each HD session - Zosyn 2.25 g IV q8h - Monitor clinical progress and culture data - Requested an updated height  and weight, adjust antibiotic dosing if needed  [facility], [place].D., BCPS Clinical Pharmacist Pager: [phone] 08/22/2014 10:22 AM

## 2014-08-23 DIAGNOSIS — R7881 Bacteremia: Secondary | ICD-10-CM | POA: Diagnosis present

## 2014-08-23 LAB — RENAL FUNCTION PANEL
Albumin: 2.8 g/dL — ABNORMAL LOW (ref 3.5–5.2)
Anion gap: 19 — ABNORMAL HIGH (ref 5–15)
BUN: 54 mg/dL — ABNORMAL HIGH (ref 6–23)
CO2: 25 meq/L (ref 19–32)
Calcium: 9.1 mg/dL (ref 8.4–10.5)
Chloride: 88 mEq/L — ABNORMAL LOW (ref 96–112)
Creatinine, Ser: 8.65 mg/dL — ABNORMAL HIGH (ref 0.50–1.35)
GFR, EST AFRICAN AMERICAN: 6 mL/min — AB (ref >90)
GFR, EST NON AFRICAN AMERICAN: 5 mL/min — AB (ref >90)
Glucose, Bld: 288 mg/dL — ABNORMAL HIGH (ref 70–99)
Phosphorus: 4.2 mg/dL (ref 2.3–4.6)
Potassium: 4.7 mEq/L (ref 3.7–5.3)
SODIUM: 132 meq/L — AB (ref 137–147)

## 2014-08-23 LAB — CBC
HCT: 35.4 % — ABNORMAL LOW (ref 39.0–52.0)
Hemoglobin: 11.9 g/dL — ABNORMAL LOW (ref 13.0–17.0)
MCH: 32.3 pg (ref 26.0–34.0)
MCHC: 33.6 g/dL (ref 30.0–36.0)
MCV: 96.2 fL (ref 78.0–100.0)
PLATELETS: 327 10*3/uL (ref 150–400)
RBC: 3.68 MIL/uL — AB (ref 4.22–5.81)
RDW: 13.3 % (ref 11.5–15.5)
WBC: 9.1 10*3/uL (ref 4.0–10.5)

## 2014-08-23 LAB — GLUCOSE, CAPILLARY
Glucose-Capillary: 215 mg/dL — ABNORMAL HIGH (ref 70–99)
Glucose-Capillary: 216 mg/dL — ABNORMAL HIGH (ref 70–99)

## 2014-08-23 LAB — GI PATHOGEN PANEL BY PCR, STOOL
C difficile toxin A/B: NEGATIVE
CRYPTOSPORIDIUM BY PCR: NEGATIVE
Campylobacter by PCR: NEGATIVE
E COLI (ETEC) LT/ST: NEGATIVE
E COLI 0157 BY PCR: NEGATIVE
E coli (STEC): NEGATIVE
G lamblia by PCR: NEGATIVE
Norovirus GI/GII: NEGATIVE
Rotavirus A by PCR: NEGATIVE
Salmonella by PCR: NEGATIVE
Shigella by PCR: NEGATIVE

## 2014-08-23 LAB — CLOSTRIDIUM DIFFICILE BY PCR: CDIFFPCR: NEGATIVE

## 2014-08-23 MED ORDER — INSULIN ASPART 100 UNIT/ML ~~LOC~~ SOLN
0.0000 [IU] | Freq: Three times a day (TID) | SUBCUTANEOUS | Status: DC
  Administered 2014-08-23 – 2014-08-24 (×2): 3 [IU] via SUBCUTANEOUS
  Administered 2014-08-24: 2 [IU] via SUBCUTANEOUS
  Administered 2014-08-24: 5 [IU] via SUBCUTANEOUS
  Administered 2014-08-25 (×2): 3 [IU] via SUBCUTANEOUS

## 2014-08-23 MED ORDER — DIPHENHYDRAMINE-ZINC ACETATE 2-0.1 % EX CREA
TOPICAL_CREAM | Freq: Three times a day (TID) | CUTANEOUS | Status: DC | PRN
  Administered 2014-08-24: 1 via TOPICAL
  Filled 2014-08-23: qty 28

## 2014-08-23 NOTE — Progress Notes (Signed)
[facility] PROGRESS NOTE   Ryan Campbell:[id number] DOB: 09/29/1940 DOA: 08/21/2014 PCP:  Melinda Crutch, MD  HPI/Subjective: Feels better, SOB and wheezing resolved.  Assessment/Plan: Principal Problem:   Acute respiratory failure Active Problems:   Essential hypertension   ESRD (end stage renal disease)   Chronic systolic heart failure   Diarrhea   Generalized weakness    GNR bacteremia Blood culture grew gram-negative bacteria, patient is on Zosyn. Patient reported that he urinate once may be every 1-2 days. Will check UA and urine culture.  Has very benign abdominal exam, normal LFTs, probably the source is not intra-abdominal infection. Continue current antibiotics, adjust antibiotics according to culture results.  Acute respiratory failure with hypoxia This is secondary to noncardiogenic pulmonary edema from missing hemodialysis. Patient admitted to telemetry, started on nebulized bronchodilators. Dialyzed yesterday, his breathing is back to his baseline, SOB and wheezing resolved. This is resolved after dialysis.  Diarrhea with generalized weakness Report multiple PMNs per day, 1 loose bowel movement since yesterday. Recently C. difficile was negative, recheck C. difficile and GI pathogen panel. CT scan of abdomen pelvis did not show colitis, patient is on IV Flagyl, I will discontinue Flagyl.  Fever Temperature up to 102.5, no marked leukocytosis or x-ray findings. Blood culture obtained, UA and urine culture will be obtained. Blood cultures positive for GNR.  ESRD Nephrology consulted, continue dialysis. Missed dialysis once last Tuesday 11/24.  Chronic systolic CHF 2-D echocardiogram in October 2014 showed the EF of 40-45%. Current pulmonary edema and hypotension is secondary to hypervolemia from missing dialysis. Continue metoprolol and aspirin.  Code Status: Full code Family Communication: Plan discussed with the patient. Disposition Plan:  Remains inpatient   Consultants:  Nephrology  Procedures:  None  Antibiotics:  Metronidazole 11/26>>> 11/27  Zosyn and vancomycin 11/26>>>   Objective: Filed Vitals:   08/23/14 1107  BP: 112/60  Pulse: 67  Temp: 99.3 F (37.4 C)  Resp: 16    Intake/Output Summary (Last 24 hours) at 08/23/14 1133 Last data filed at 08/23/14 1107  Gross per 24 hour  Intake    840 ml  Output   3158 ml  Net  -2318 ml   Filed Weights   08/22/14 2034 08/23/14 0700 08/23/14 1107  Weight: 124.648 kg (274 lb 12.8 oz) 121.6 kg (268 lb 1.3 oz) 117.8 kg (259 lb 11.2 oz)    Exam: General: Alert and awake, oriented x3, not in any acute distress. HEENT: anicteric sclera, pupils reactive to light and accommodation, EOMI CVS: S1-S2 clear, no murmur rubs or gallops Chest: clear to auscultation bilaterally, no wheezing, rales or rhonchi Abdomen: soft nontender, nondistended, normal bowel sounds, no organomegaly Extremities: no cyanosis, clubbing or edema noted bilaterally Neuro: Cranial nerves II-XII intact, no focal neurological deficits  Data Reviewed: Basic Metabolic Panel:  Recent Labs Lab 08/16/14 1325 08/21/14 0930 08/22/14 0421 08/23/14 0714  NA 135* 135* 133* 132*  K 5.4* 5.0 5.1 4.7  CL 89* 88* 90* 88*  CO2 27 28 22 25   GLUCOSE 221* 217* 191* 288*  BUN 59* 43* 37* 54*  CREATININE 9.66* 8.11* 7.31* 8.65*  CALCIUM 9.9 9.8 9.1 9.1  PHOS  --   --   --  4.2   Liver Function Tests:  Recent Labs Lab 08/16/14 1325 08/21/14 0930 08/23/14 0714  AST 20 32  --   ALT 11 14  --   ALKPHOS 75 77  --   BILITOT 4.9* 3.3*  --   PROT 7.9  7.9  --   ALBUMIN 3.5 3.1* 2.8*   No results for input(s): LIPASE, AMYLASE in the last 168 hours. No results for input(s): AMMONIA in the last 168 hours. CBC:  Recent Labs Lab 08/16/14 1325 08/21/14 0930 08/22/14 0421 08/23/14 0435  WBC 10.0 10.0 8.7 9.1  NEUTROABS 8.0* 7.2  --   --   HGB 13.2 13.7 12.6* 11.9*  HCT 39.8 38.5* 36.5*  35.4*  MCV 92.8 93.2 91.7 96.2  PLT 97* 169 178 327   Cardiac Enzymes: No results for input(s): CKTOTAL, CKMB, CKMBINDEX, TROPONINI in the last 168 hours. BNP (last 3 results)  Recent Labs  08/21/14 0930  PROBNP >70000.0*   CBG:  Recent Labs Lab 08/21/14 2054  GLUCAP 168*    Micro Recent Results (from the past 240 hour(s))  Clostridium Difficile by PCR     Status: None   Collection Time: 08/16/14  5:16 PM  Result Value Ref Range Status   C difficile by pcr NEGATIVE NEGATIVE Final  Culture, blood (routine x 2)     Status: None (Preliminary result)   Collection Time: 08/21/14  7:50 PM  Result Value Ref Range Status   Specimen Description BLOOD RIGHT HAND  Final   Special Requests BOTTLES DRAWN AEROBIC ONLY 5CC  Final   Culture  Setup Time   Final    08/22/2014 01:53 Performed at [place]   Final    [place] Performed at [facility]    Report Status PENDING  Incomplete  Culture, blood (routine x 2)     Status: None (Preliminary result)   Collection Time: 08/21/14  7:57 PM  Result Value Ref Range Status   Specimen Description BLOOD RIGHT HAND  Final   Special Requests BOTTLES DRAWN AEROBIC ONLY 5CC  Final   Culture  Setup Time   Final    08/22/2014 01:44 Performed at [place]   Final    [place] Performed at [facility]    Report Status PENDING  Incomplete  Clostridium Difficile by PCR     Status: None   Collection Time: 08/22/14 10:10 PM  Result Value Ref Range Status   C difficile by pcr NEGATIVE NEGATIVE Final     Studies: Ct Abdomen Pelvis Wo Contrast  08/22/2014   CLINICAL DATA:  Family reports patient is been lethargic for the past week. Recent diagnosis of pneumonia. Diarrhea. Missed dialysis due to weakness.  EXAM: CT ABDOMEN AND PELVIS WITHOUT CONTRAST  TECHNIQUE: Multidetector CT imaging of the abdomen and pelvis was performed following the standard protocol  without IV contrast.  COMPARISON:  03/27/2014  FINDINGS: Lower chest: There is moderate cardiac enlargement. Left chest wall AICD lead is identified within the right ventricle. Calcifications within the RCA and left circumflex coronary arteries are noted. Small bilateral pleural effusions noted.  Hepatobiliary: No focal liver abnormality. Gallbladder sludge identified. No biliary dilatation.  Pancreas: The pancreas appears normal.  Spleen: Normal appearance of the spleen.  Adrenals/Urinary Tract: Normal adrenal glands. Atrophy of the left kidney. Asymmetric right-sided nephromegaly and hydronephrosis and hydroureter noted. Increased attenuation within the dilated right ureter is noted up to the level of the urinary bladder. There is increased attenuation within the lumen of the bladder. Partially collapsed bladder exhibits mild bladder wall thickening.  Stomach/Bowel: The stomach is normal. The small bowel loops have a normal course and caliber. Unremarkable appearance of the colon.  Vascular/Lymphatic: Calcified  atherosclerotic disease involves the abdominal aorta. No aneurysm. No enlarged retroperitoneal or mesenteric adenopathy. No enlarged pelvic or inguinal lymph nodes.  Reproductive: Prostate gland enlargement noted.  Other: Patient has a peritoneal dialysis catheter. The tip of the catheter is in the right lower quadrant of the abdomen. There is a moderate amount of ascites identified within the abdomen and pelvis.  Musculoskeletal: Changes of chronic renal osteodystrophy identified. Advanced degenerative disc disease noted at L4-5 and L5-S1. Facet hypertrophy and degenerative change noted bilaterally.  IMPRESSION: 1. Pleural effusions and body wall edema noted consistent with a fluid overload state perhaps due to missing dialysis. 2. Peritoneal dialysis catheter is in place and there is a moderate amount of fluid within the abdomen and pelvis. Likely dialysate. 3. Right-sided hydronephrosis and hydroureter.  Intermediate attenuating fluid within the right renal collecting system, ureter and bladder which may reflect blood clot. Underlying urothelial lesion cannot be excluded. No stone noted. Recommend followup with hematuria protocol CT when the patient is able to be dialyzed appropriately.   Electronically Signed   By: Kerby Moors M.D.   On: 08/22/2014 02:36    Scheduled Meds: . antiseptic oral rinse  7 mL Mouth Rinse BID  . aspirin  81 mg Oral Daily  . enoxaparin (LOVENOX) injection  30 mg Subcutaneous Q24H  . ferrous sulfate  325 mg Oral BID  . levalbuterol  0.63 mg Nebulization BID  . metoprolol  50 mg Oral BID  . metronidazole  500 mg Intravenous Q8H  . piperacillin-tazobactam (ZOSYN)  IV  2.25 g Intravenous Q8H  . sevelamer carbonate  1,600 mg Oral Daily  . sodium chloride  3 mL Intravenous Q12H   Continuous Infusions:      Time spent: 35 minutes    [facility] A  [facility] Pager [phone] If 7PM-7AM, please contact night-coverage at [email or web address], password [facility] 08/23/2014, 11:33 AM  LOS: 2 days

## 2014-08-23 NOTE — Progress Notes (Signed)
Hemodialysis- Post dialysis pt seemed slighly confused. Re oriented to place. Knows name/situation. Dr. Justin Mend notified. Per MD this is baseline for pt per outpatient dialysis center. Will notify primary RN. No complaints, vitals stable, transferred back to [place] [facility].

## 2014-08-23 NOTE — Progress Notes (Signed)
[facility] ROUNDING NOTE   Subjective:   Interval History:no complaints very much improved  BC positive gm negative rods  Objective:  Vital signs in last 24 hours:  Temp:  [98.8 F (37.1 C)-100.4 F (38 C)] 99.5 F (37.5 C) (11/27 0700) Pulse Rate:  [67-84] 67 (11/27 0857) Resp:  [18-19] 19 (11/27 0700) BP: (89-112)/(41-66) 104/54 mmHg (11/27 0857) SpO2:  [83 %-99 %] 95 % (11/27 0700) Weight:  [121.6 kg (268 lb 1.3 oz)-124.648 kg (274 lb 12.8 oz)] 121.6 kg (268 lb 1.3 oz) (11/27 0700)  Weight change:  Filed Weights   08/22/14 2034 08/23/14 0700  Weight: 124.648 kg (274 lb 12.8 oz) 121.6 kg (268 lb 1.3 oz)    Intake/Output: I/O last 3 completed shifts: In: 1640 [P.O.:1640] Out: -    Intake/Output this shift:     CVS- RRR RS- CTA ABD- BS present soft non-distended EXT- no edema   Basic Metabolic Panel:  Recent Labs Lab 08/16/14 1325 08/21/14 0930 08/22/14 0421 08/23/14 0714  NA 135* 135* 133* 132*  K 5.4* 5.0 5.1 4.7  CL 89* 88* 90* 88*  CO2 27 28 22 25   GLUCOSE 221* 217* 191* 288*  BUN 59* 43* 37* 54*  CREATININE 9.66* 8.11* 7.31* 8.65*  CALCIUM 9.9 9.8 9.1 9.1  PHOS  --   --   --  4.2    Liver Function Tests:  Recent Labs Lab 08/16/14 1325 08/21/14 0930 08/23/14 0714  AST 20 32  --   ALT 11 14  --   ALKPHOS 75 77  --   BILITOT 4.9* 3.3*  --   PROT 7.9 7.9  --   ALBUMIN 3.5 3.1* 2.8*   No results for input(s): LIPASE, AMYLASE in the last 168 hours. No results for input(s): AMMONIA in the last 168 hours.  CBC:  Recent Labs Lab 08/16/14 1325 08/21/14 0930 08/22/14 0421 08/23/14 0435  WBC 10.0 10.0 8.7 9.1  NEUTROABS 8.0* 7.2  --   --   HGB 13.2 13.7 12.6* 11.9*  HCT 39.8 38.5* 36.5* 35.4*  MCV 92.8 93.2 91.7 96.2  PLT 97* 169 178 327    Cardiac Enzymes: No results for input(s): CKTOTAL, CKMB, CKMBINDEX, TROPONINI in the last 168 hours.  BNP: Invalid input(s): POCBNP  CBG:  Recent Labs Lab 08/21/14 2054   GLUCAP 168*    Microbiology: Results for orders placed or performed during the hospital encounter of 08/21/14  Culture, blood (routine x 2)     Status: None (Preliminary result)   Collection Time: 08/21/14  7:50 PM  Result Value Ref Range Status   Specimen Description BLOOD RIGHT HAND  Final   Special Requests BOTTLES DRAWN AEROBIC ONLY 5CC  Final   Culture  Setup Time   Final    08/22/2014 01:53 Performed at [place]   Final    [place] Performed at [facility]    Report Status PENDING  Incomplete  Culture, blood (routine x 2)     Status: None (Preliminary result)   Collection Time: 08/21/14  7:57 PM  Result Value Ref Range Status   Specimen Description BLOOD RIGHT HAND  Final   Special Requests BOTTLES DRAWN AEROBIC ONLY 5CC  Final   Culture  Setup Time   Final    08/22/2014 01:44 Performed at [place] Performed at [facility]  Report Status PENDING  Incomplete  Clostridium Difficile by PCR     Status: None   Collection Time: 08/22/14 10:10 PM  Result Value Ref Range Status   C difficile by pcr NEGATIVE NEGATIVE Final    Coagulation Studies: No results for input(s): LABPROT, INR in the last 72 hours.  Urinalysis: No results for input(s): COLORURINE, LABSPEC, PHURINE, GLUCOSEU, HGBUR, BILIRUBINUR, KETONESUR, PROTEINUR, UROBILINOGEN, NITRITE, LEUKOCYTESUR in the last 72 hours.  Invalid input(s): APPERANCEUR    Imaging: Ct Abdomen Pelvis Wo Contrast  08/22/2014   CLINICAL DATA:  Family reports patient is been lethargic for the past week. Recent diagnosis of pneumonia. Diarrhea. Missed dialysis due to weakness.  EXAM: CT ABDOMEN AND PELVIS WITHOUT CONTRAST  TECHNIQUE: Multidetector CT imaging of the abdomen and pelvis was performed following the standard protocol without IV contrast.  COMPARISON:  03/27/2014  FINDINGS: Lower chest: There is moderate cardiac  enlargement. Left chest wall AICD lead is identified within the right ventricle. Calcifications within the RCA and left circumflex coronary arteries are noted. Small bilateral pleural effusions noted.  Hepatobiliary: No focal liver abnormality. Gallbladder sludge identified. No biliary dilatation.  Pancreas: The pancreas appears normal.  Spleen: Normal appearance of the spleen.  Adrenals/Urinary Tract: Normal adrenal glands. Atrophy of the left kidney. Asymmetric right-sided nephromegaly and hydronephrosis and hydroureter noted. Increased attenuation within the dilated right ureter is noted up to the level of the urinary bladder. There is increased attenuation within the lumen of the bladder. Partially collapsed bladder exhibits mild bladder wall thickening.  Stomach/Bowel: The stomach is normal. The small bowel loops have a normal course and caliber. Unremarkable appearance of the colon.  Vascular/Lymphatic: Calcified atherosclerotic disease involves the abdominal aorta. No aneurysm. No enlarged retroperitoneal or mesenteric adenopathy. No enlarged pelvic or inguinal lymph nodes.  Reproductive: Prostate gland enlargement noted.  Other: Patient has a peritoneal dialysis catheter. The tip of the catheter is in the right lower quadrant of the abdomen. There is a moderate amount of ascites identified within the abdomen and pelvis.  Musculoskeletal: Changes of chronic renal osteodystrophy identified. Advanced degenerative disc disease noted at L4-5 and L5-S1. Facet hypertrophy and degenerative change noted bilaterally.  IMPRESSION: 1. Pleural effusions and body wall edema noted consistent with a fluid overload state perhaps due to missing dialysis. 2. Peritoneal dialysis catheter is in place and there is a moderate amount of fluid within the abdomen and pelvis. Likely dialysate. 3. Right-sided hydronephrosis and hydroureter. Intermediate attenuating fluid within the right renal collecting system, ureter and bladder  which may reflect blood clot. Underlying urothelial lesion cannot be excluded. No stone noted. Recommend followup with hematuria protocol CT when the patient is able to be dialyzed appropriately.   Electronically Signed   By: Kerby Moors M.D.   On: 08/22/2014 02:36   Dg Chest 2 View  08/21/2014   CLINICAL DATA:  Shortness of breath and weakness  EXAM: CHEST  2 VIEW  COMPARISON:  08/16/2014  FINDINGS: Cardiac shadow remains enlarged. A defibrillator is again seen. The cephalization seen previously has resolved. No focal infiltrate or sizable effusion is seen. No acute bony abnormality is noted.  IMPRESSION: Stable cardiomegaly.  No acute abnormality seen.   Electronically Signed   By: Inez Catalina M.D.   On: 08/21/2014 10:28     Medications:     . antiseptic oral rinse  7 mL Mouth Rinse BID  . aspirin  81 mg Oral Daily  . enoxaparin (LOVENOX) injection  30 mg Subcutaneous Q24H  .  ferrous sulfate  325 mg Oral BID  . levalbuterol  0.63 mg Nebulization BID  . metoprolol  50 mg Oral BID  . metronidazole  500 mg Intravenous Q8H  . piperacillin-tazobactam (ZOSYN)  IV  2.25 g Intravenous Q8H  . sevelamer carbonate  1,600 mg Oral Daily  . sodium chloride  3 mL Intravenous Q12H  . vancomycin  1,000 mg Intravenous Once   acetaminophen **OR** acetaminophen, HYDROcodone-acetaminophen, HYDROmorphone (DILAUDID) injection, ondansetron **OR** ondansetron (ZOFRAN) IV  Assessment/ Plan:   ESRD- TTS Plan dialysis in AM to get back on schedule  ANEMIA- controlled Hb 12.6  MBD- check phosphorus  HTN/VOL- overload improved  ACCESS-thigh graft   Blood cultures positive no obvious source may stop vancomycin continue zosyn   LOS: 2 Ryan Campbell @TODAY @9 :22 AM

## 2014-08-23 NOTE — Procedures (Signed)
I have seen and examined this patient and agree with the plan of care . Patient seen on dialysis  . No complaints  Tracey Hermance W 08/23/2014, 9:25 AM

## 2014-08-23 NOTE — Progress Notes (Signed)
OT Cancellation Note  Patient Details Name: Ryan Campbell MRN: [id number] DOB: 03-Nov-1940   Cancelled Treatment:    Reason Eval/Treat Not Completed: Patient at procedure or test/ unavailable. Pt in hemo.  Almon Register [id number] 08/23/2014, 7:31 AM

## 2014-08-24 DIAGNOSIS — R7881 Bacteremia: Secondary | ICD-10-CM

## 2014-08-24 DIAGNOSIS — B961 Klebsiella pneumoniae [K. pneumoniae] as the cause of diseases classified elsewhere: Secondary | ICD-10-CM

## 2014-08-24 LAB — CULTURE, BLOOD (ROUTINE X 2)

## 2014-08-24 LAB — GLUCOSE, CAPILLARY
GLUCOSE-CAPILLARY: 235 mg/dL — AB (ref 70–99)
GLUCOSE-CAPILLARY: 261 mg/dL — AB (ref 70–99)
Glucose-Capillary: 175 mg/dL — ABNORMAL HIGH (ref 70–99)
Glucose-Capillary: 273 mg/dL — ABNORMAL HIGH (ref 70–99)

## 2014-08-24 LAB — RENAL FUNCTION PANEL
ALBUMIN: 2.6 g/dL — AB (ref 3.5–5.2)
Anion gap: 18 — ABNORMAL HIGH (ref 5–15)
BUN: 40 mg/dL — ABNORMAL HIGH (ref 6–23)
CHLORIDE: 91 meq/L — AB (ref 96–112)
CO2: 25 mEq/L (ref 19–32)
Calcium: 8.9 mg/dL (ref 8.4–10.5)
Creatinine, Ser: 6.93 mg/dL — ABNORMAL HIGH (ref 0.50–1.35)
GFR calc Af Amer: 8 mL/min — ABNORMAL LOW (ref >90)
GFR calc non Af Amer: 7 mL/min — ABNORMAL LOW (ref >90)
Glucose, Bld: 266 mg/dL — ABNORMAL HIGH (ref 70–99)
PHOSPHORUS: 3.5 mg/dL (ref 2.3–4.6)
Potassium: 4.1 mEq/L (ref 3.7–5.3)
SODIUM: 134 meq/L — AB (ref 137–147)

## 2014-08-24 LAB — CBC
HEMATOCRIT: 32.4 % — AB (ref 39.0–52.0)
Hemoglobin: 11.7 g/dL — ABNORMAL LOW (ref 13.0–17.0)
MCH: 32.8 pg (ref 26.0–34.0)
MCHC: 36.1 g/dL — AB (ref 30.0–36.0)
MCV: 90.8 fL (ref 78.0–100.0)
PLATELETS: 182 10*3/uL (ref 150–400)
RBC: 3.57 MIL/uL — ABNORMAL LOW (ref 4.22–5.81)
RDW: 16.2 % — AB (ref 11.5–15.5)
WBC: 7.7 10*3/uL (ref 4.0–10.5)

## 2014-08-24 MED ORDER — ALTEPLASE 2 MG IJ SOLR
2.0000 mg | Freq: Once | INTRAMUSCULAR | Status: DC | PRN
  Filled 2014-08-24: qty 2

## 2014-08-24 MED ORDER — LIDOCAINE HCL (PF) 1 % IJ SOLN: 5.0000 mL | INTRAMUSCULAR | Status: DC | PRN

## 2014-08-24 MED ORDER — LIDOCAINE-PRILOCAINE 2.5-2.5 % EX CREA: 1.0000 "application " | TOPICAL_CREAM | CUTANEOUS | Status: DC | PRN

## 2014-08-24 MED ORDER — SODIUM CHLORIDE 0.9 % IV SOLN: 100.0000 mL | INTRAVENOUS | Status: DC | PRN

## 2014-08-24 MED ORDER — NEPRO/CARBSTEADY PO LIQD: 237.0000 mL | ORAL | Status: DC | PRN

## 2014-08-24 MED ORDER — PENTAFLUOROPROP-TETRAFLUOROETH EX AERO: 1.0000 "application " | INHALATION_SPRAY | CUTANEOUS | Status: DC | PRN

## 2014-08-24 MED ORDER — LEVALBUTEROL HCL 0.63 MG/3ML IN NEBU: 0.6300 mg | INHALATION_SOLUTION | Freq: Four times a day (QID) | RESPIRATORY_TRACT | Status: DC | PRN

## 2014-08-24 MED ORDER — HEPARIN SODIUM (PORCINE) 1000 UNIT/ML DIALYSIS
1000.0000 [IU] | INTRAMUSCULAR | Status: DC | PRN
  Filled 2014-08-24: qty 1

## 2014-08-24 NOTE — Progress Notes (Signed)
[facility] PROGRESS NOTE   Ryan Campbell QQV:[id number] DOB: 03/29/1941 DOA: 08/21/2014 PCP:  Melinda Crutch, MD  HPI/Subjective: Feels better, he did not remember I told him yesterday about his bloodstream infection.  Assessment/Plan: Principal Problem:   Acute respiratory failure Active Problems:   Essential hypertension   ESRD (end stage renal disease)   Chronic systolic heart failure   Diarrhea   Generalized weakness   Bacteremia due to Klebsiella pneumoniae    Klebsiella pneumoniae bacteremia Blood culture grew gram-negative bacteria, patient is on Zosyn. Patient reported that he urinate once may be every 1-2 days. Will check UA and urine culture.  Has very benign abdominal exam, normal LFTs, probably the source is not intra-abdominal infection. Continue current antibiotics, adjust antibiotics according to culture results. Culture showed pansensitive (except ampicillin) Klebsiella pneumoniae, continue Zosyn today, switch to Cipro and a.m.  Acute respiratory failure with hypoxia This is secondary to noncardiogenic pulmonary edema from missing hemodialysis. Patient admitted to telemetry, started on nebulized bronchodilators. Dialyzed yesterday, his breathing is back to his baseline, SOB and wheezing resolved. This is resolved after dialysis.  Diarrhea with generalized weakness Report multiple PMNs per day, 1 loose bowel movement since yesterday. Recently C. difficile was negative, recheck C. difficile and GI pathogen panel. CT scan of abdomen pelvis did not show colitis, patient is on IV Flagyl, I will discontinue Flagyl.  Fever Temperature up to 102.5, no marked leukocytosis or x-ray findings. Blood culture obtained, UA and urine culture will be obtained. Blood cultures positive for GNR.  ESRD Nephrology consulted, continue dialysis. Missed dialysis once last Tuesday 11/24.  Chronic systolic CHF 2-D echocardiogram in October 2014 showed the EF of  40-45%. Current pulmonary edema and hypotension is secondary to hypervolemia from missing dialysis. Continue metoprolol and aspirin.  Code Status: Full code Family Communication: Plan discussed with the patient. Disposition Plan: Remains inpatient   Consultants:  Nephrology  Procedures:  None  Antibiotics:  Metronidazole 11/26>>> 11/27  Zosyn and vancomycin 11/26>>>   Objective: Filed Vitals:   08/24/14 1230  BP: 117/60  Pulse: 62  Temp:   Resp:     Intake/Output Summary (Last 24 hours) at 08/24/14 1248 Last data filed at 08/24/14 0030  Gross per 24 hour  Intake    840 ml  Output      0 ml  Net    840 ml   Filed Weights   08/23/14 1107 08/23/14 2022 08/24/14 0925  Weight: 117.8 kg (259 lb 11.2 oz) 117.799 kg (259 lb 11.2 oz) 118.1 kg (260 lb 5.8 oz)    Exam: General: Alert and awake, oriented x3, not in any acute distress. HEENT: anicteric sclera, pupils reactive to light and accommodation, EOMI CVS: S1-S2 clear, no murmur rubs or gallops Chest: clear to auscultation bilaterally, no wheezing, rales or rhonchi Abdomen: soft nontender, nondistended, normal bowel sounds, no organomegaly Extremities: no cyanosis, clubbing or edema noted bilaterally Neuro: Cranial nerves II-XII intact, no focal neurological deficits  Data Reviewed: Basic Metabolic Panel:  Recent Labs Lab 08/21/14 0930 08/22/14 0421 08/23/14 0714 08/24/14 0941  NA 135* 133* 132* 134*  K 5.0 5.1 4.7 4.1  CL 88* 90* 88* 91*  CO2 28 22 25 25   GLUCOSE 217* 191* 288* 266*  BUN 43* 37* 54* 40*  CREATININE 8.11* 7.31* 8.65* 6.93*  CALCIUM 9.8 9.1 9.1 8.9  PHOS  --   --  4.2 3.5   Liver Function Tests:  Recent Labs Lab 08/21/14 0930 08/23/14 0714 08/24/14 0941  AST 32  --   --   ALT 14  --   --   ALKPHOS 77  --   --   BILITOT 3.3*  --   --   PROT 7.9  --   --   ALBUMIN 3.1* 2.8* 2.6*   No results for input(s): LIPASE, AMYLASE in the last 168 hours. No results for input(s):  AMMONIA in the last 168 hours. CBC:  Recent Labs Lab 08/21/14 0930 08/22/14 0421 08/23/14 0435 08/24/14 0941  WBC 10.0 8.7 9.1 7.7  NEUTROABS 7.2  --   --   --   HGB 13.7 12.6* 11.9* 11.7*  HCT 38.5* 36.5* 35.4* 32.4*  MCV 93.2 91.7 96.2 90.8  PLT 169 178 327 182   Cardiac Enzymes: No results for input(s): CKTOTAL, CKMB, CKMBINDEX, TROPONINI in the last 168 hours. BNP (last 3 results)  Recent Labs  08/21/14 0930  PROBNP >70000.0*   CBG:  Recent Labs Lab 08/21/14 2054 08/23/14 1624 08/23/14 2026 08/24/14 0718  GLUCAP 168* 215* 216* 235*    Micro Recent Results (from the past 240 hour(s))  Clostridium Difficile by PCR     Status: None   Collection Time: 08/16/14  5:16 PM  Result Value Ref Range Status   C difficile by pcr NEGATIVE NEGATIVE Final  Culture, blood (routine x 2)     Status: None   Collection Time: 08/21/14  7:50 PM  Result Value Ref Range Status   Specimen Description BLOOD RIGHT HAND  Final   Special Requests BOTTLES DRAWN AEROBIC ONLY 5CC  Final   Culture  Setup Time   Final    08/22/2014 01:53 Performed at [facility]    Culture   Final    KLEBSIELLA PNEUMONIAE Note: SUSCEPTIBILITIES PERFORMED ON PREVIOUS CULTURE WITHIN THE LAST 5 DAYS. Note: Gram Stain Report Called to,Read Back By and Verified With: REBECCA QIU ON 08/22/2014 AT 11:04P BY WILEJ Performed at [facility]    Report Status 08/24/2014 FINAL  Final  Culture, blood (routine x 2)     Status: None   Collection Time: 08/21/14  7:57 PM  Result Value Ref Range Status   Specimen Description BLOOD RIGHT HAND  Final   Special Requests BOTTLES DRAWN AEROBIC ONLY 5CC  Final   Culture  Setup Time   Final    08/22/2014 01:44 Performed at [facility]    Culture   Final    KLEBSIELLA PNEUMONIAE Note: Gram Stain Report Called to,Read Back By and Verified With: REBECCA QIU ON 08/22/2014 AT 11:04P BY WILEJ Performed at [facility]    Report Status  08/24/2014 FINAL  Final   Organism ID, Bacteria KLEBSIELLA PNEUMONIAE  Final      Susceptibility   Klebsiella pneumoniae - MIC*    AMPICILLIN >=32 RESISTANT Resistant     AMPICILLIN/SULBACTAM 4 SENSITIVE Sensitive     CEFAZOLIN <=4 SENSITIVE Sensitive     CEFEPIME <=1 SENSITIVE Sensitive     CEFTAZIDIME <=1 SENSITIVE Sensitive     CEFTRIAXONE <=1 SENSITIVE Sensitive     CIPROFLOXACIN <=0.25 SENSITIVE Sensitive     GENTAMICIN <=1 SENSITIVE Sensitive     IMIPENEM <=0.25 SENSITIVE Sensitive     PIP/TAZO <=4 SENSITIVE Sensitive     TOBRAMYCIN <=1 SENSITIVE Sensitive     TRIMETH/SULFA <=20 SENSITIVE Sensitive     * KLEBSIELLA PNEUMONIAE  Clostridium Difficile by PCR     Status: None   Collection Time: 08/22/14 10:10 PM  Result Value  Ref Range Status   C difficile by pcr NEGATIVE NEGATIVE Final     Studies: No results found.  Scheduled Meds: . antiseptic oral rinse  7 mL Mouth Rinse BID  . aspirin  81 mg Oral Daily  . enoxaparin (LOVENOX) injection  30 mg Subcutaneous Q24H  . ferrous sulfate  325 mg Oral BID  . insulin aspart  0-9 Units Subcutaneous TID WC  . metoprolol  50 mg Oral BID  . piperacillin-tazobactam (ZOSYN)  IV  2.25 g Intravenous Q8H  . sevelamer carbonate  1,600 mg Oral Daily  . sodium chloride  3 mL Intravenous Q12H   Continuous Infusions:      Time spent: 35 minutes    [facility] A  [facility] Pager [phone] If 7PM-7AM, please contact night-coverage at [email or web address], password [facility] 08/24/2014, 12:48 PM  LOS: 3 days

## 2014-08-24 NOTE — Progress Notes (Signed)
Nutrition Brief Note  Patient identified on the Malnutrition Screening Tool (MST) Report  Wt Readings from Last 15 Encounters:  08/24/14 260 lb 5.8 oz (118.1 kg)  04/22/14 274 lb 12.8 oz (124.648 kg)  03/20/14 276 lb 1.9 oz (125.247 kg)  07/06/13 272 lb (123.378 kg)  03/07/13 277 lb 12.8 oz (126.009 kg)    Body mass index is 37.36 kg/(m^2). Patient meets criteria for Obesity based on current BMI.   Current diet order is Renal/Carb Modified, patient is consuming approximately 100% of meals at this time. Patient reports that his usual dry weight is 124 kg but, recently after dialysis his weight was 118 kg. He is unsure of the cause of weight loss- possibly HD. He reports that his appetite has been great and he was eating well PTA. Patient denies any nutrition questions or concerns at this time.  Labs and medications reviewed.   No nutrition interventions warranted at this time. If nutrition issues arise, please consult RD.   Pryor Ochoa RD, LDN Inpatient Clinical Dietitian Pager: [phone] After Hours Pager: [phone]

## 2014-08-24 NOTE — Procedures (Signed)
I have seen and examined this patient and agree with the plan of care. Patient was seen on dialysis with no complaints and no issues he is symptomatically improved Conard Alvira W 08/24/2014, 8:38 AM

## 2014-08-24 NOTE — Plan of Care (Signed)
Problem: Phase I Progression Outcomes Goal: OOB as tolerated unless otherwise ordered Outcome: Completed/Met Date Met:  08/24/14     

## 2014-08-25 LAB — GLUCOSE, CAPILLARY
GLUCOSE-CAPILLARY: 234 mg/dL — AB (ref 70–99)
GLUCOSE-CAPILLARY: 242 mg/dL — AB (ref 70–99)

## 2014-08-25 MED ORDER — CEFAZOLIN SODIUM-DEXTROSE 2-3 GM-% IV SOLR
2.0000 g | Freq: Once | INTRAVENOUS | Status: AC
  Administered 2014-08-25: 2 g via INTRAVENOUS
  Filled 2014-08-25: qty 50

## 2014-08-25 MED ORDER — CEFAZOLIN SODIUM 1 G IJ SOLR: 2.0000 g | Freq: Once | INTRAMUSCULAR | Status: AC

## 2014-08-25 MED ORDER — SACCHAROMYCES BOULARDII 250 MG PO CAPS: 250.0000 mg | ORAL_CAPSULE | Freq: Two times a day (BID) | ORAL | Status: AC

## 2014-08-25 NOTE — Plan of Care (Signed)
Problem: Discharge Progression Outcomes Goal: Discharge plan in place and appropriate Outcome: Completed/Met Date Met:  08/25/14 Goal: Pain controlled with appropriate interventions Outcome: Completed/Met Date Met:  08/25/14 Goal: Hemodynamically stable Outcome: Completed/Met Date Met:  75/19/82 Goal: Complications resolved/controlled Outcome: Completed/Met Date Met:  08/25/14 Goal: Tolerating diet Outcome: Completed/Met Date Met:  08/25/14 Goal: Activity appropriate for discharge plan Outcome: Completed/Met Date Met:  08/25/14 Goal: Other Discharge Outcomes/Goals Outcome: Not Applicable Date Met:  42/99/80

## 2014-08-25 NOTE — Evaluation (Signed)
Occupational Therapy Evaluation Patient Details Name: Ryan Campbell MRN: [id number] DOB: 03-23-1941 Today's Date: 08/25/2014    History of Present Illness Patient is a 73 year old male with ESRD on HD, TTS, diabetes, hypertension, chronic systolic and diastolic CHF, EF 92%, brought to ER by his wife for shortness of breath and diarrhea   Clinical Impression   Pt admitted with SOB and diarrhea. Pt currently with functional limitations due to the deficits listed below (see OT Problem List).  Pt will benefit from skilled OT to increase their safety and independence with ADL and functional mobility for ADL to facilitate discharge to venue listed below.     Follow Up Recommendations  Home health OT;Supervision/Assistance - 24 hour    Equipment Recommendations  None recommended by OT       Precautions / Restrictions Precautions Precautions: Fall Restrictions Weight Bearing Restrictions: No      Mobility Bed Mobility               General bed mobility comments: pt in chair  Transfers Overall transfer level: Needs assistance Equipment used: 1 person hand held assist Transfers: Sit to/from Stand Sit to Stand: Min guard         General transfer comment: Pt needed VC to push up with arms, as well as widen BOS    Balance Overall balance assessment: Needs assistance Sitting-balance support: No upper extremity supported Sitting balance-Leahy Scale: Good                                      ADL Overall ADL's : Needs assistance/impaired Eating/Feeding: Set up;Sitting   Grooming: Set up;Sitting   Upper Body Bathing: Minimal assitance;Sitting   Lower Body Bathing: Sit to/from stand;Minimal assistance       Lower Body Dressing: Minimal assistance;Sit to/from stand   Toilet Transfer: Minimal assistance;RW   Toileting- Clothing Manipulation and Hygiene: Sit to/from stand;Minimal assistance       Functional mobility during ADLs: Minimal  assistance;Rolling walker General ADL Comments: reccomend pt use his RW as he was a little off balance. Wife not present- but pt will also see PT today also     Vision                            Pertinent Vitals/Pain Pain Assessment: No/denies pain     Hand Dominance     Extremity/Trunk Assessment Upper Extremity Assessment Upper Extremity Assessment: Generalized weakness           Communication Communication Communication: No difficulties   Cognition Arousal/Alertness: Awake/alert Behavior During Therapy: WFL for tasks assessed/performed Overall Cognitive Status: No family/caregiver present to determine baseline cognitive functioning                     General Comments   Wife will need to be able to provide significant A             Home Living Family/patient expects to be discharged to:: Private residence Living Arrangements: Spouse/significant other   Type of Home: House Home Access: Stairs to enter Technical brewer of Steps: 5   Home Layout: Two level     Bathroom Shower/Tub: Teacher, early years/pre: Handicapped height     Home Equipment: Environmental consultant - 2 wheels          Prior Functioning/Environment Level of Independence: Independent with assistive  device(s)             OT Diagnosis: Generalized weakness   OT Problem List: Decreased strength;Decreased activity tolerance;Impaired balance (sitting and/or standing)   OT Treatment/Interventions: Self-care/ADL training;DME and/or AE instruction;Patient/family education    OT Goals(Current goals can be found in the care plan section) Acute Rehab OT Goals Patient Stated Goal: go home today OT Goal Formulation: With patient Time For Goal Achievement: 09/01/14 Potential to Achieve Goals: Good  OT Frequency: Min 2X/week   Barriers to D/C:               End of Session Equipment Utilized During Treatment: Gait belt;Rolling walker Nurse Communication: Mobility  status  Activity Tolerance: Patient tolerated treatment well Patient left: in chair;with call bell/phone within reach;with chair alarm set   Time: 1100-1138 OT Time Calculation (min): 38 min Charges:  OT General Charges $OT Visit: 1 Procedure OT Evaluation $Initial OT Evaluation Tier I: 1 Procedure OT Treatments $Self Care/Home Management : 23-37 mins G-Codes:    Payton Mccallum D 09/05/14, 11:54 AM

## 2014-08-25 NOTE — Discharge Summary (Signed)
Physician Discharge Summary  Ryan Campbell EYC:[id number] DOB: 1940-11-09 DOA: 08/21/2014  PCP:  Melinda Crutch, MD  Admit date: 08/21/2014 Discharge date: 08/25/2014  Time spent: 40 minutes  Recommendations for Outpatient Follow-up:   Follow-up with primary care physician within one week.  Continue cefazolin for total of 2 weeks for Klebsiella pneumoniae bacteremia end date is 09/08/2014.  Discharge Diagnoses:  Principal Problem:   Acute respiratory failure Active Problems:   Essential hypertension   ESRD (end stage renal disease)   Chronic systolic heart failure   Diarrhea   Generalized weakness   Bacteremia due to Klebsiella pneumoniae   Discharge Condition: Stable  Diet recommendation: Heart healthy  Filed Weights   08/24/14 0925 08/24/14 1235 08/25/14 0154  Weight: 118.1 kg (260 lb 5.8 oz) 118.1 kg (260 lb 5.8 oz) 119 kg (262 lb 5.6 oz)    History of present illness:  Patient is a 73 year old male with ESRD on HD, TTS, diabetes, hypertension, chronic systolic and diastolic CHF, EF 14%, brought to ER by his wife for shortness of breath and diarrhea. Patient's wife provided the most history and stated that he has been lethargic for the past 1 week, diarrhea since Thursday 6 days, loose watery multiple BMs in a day. Patient reports that he was not feeling well on last Thursday on 11/20, missed his dialysis, subsequently received his dialysis on Saturday, then on Monday 11/23 due to holiday scheduling. Patient was supposed to go for dialysis today however patient's wife brought him to the ED for evaluation. She reported that he was not breathing well, lower extremity swelling worse than normal. She reported to him having chills but otherwise no fevers, dysuria, chest pain. She did not report seeing any hematochezia or melena. In ED, patient was noted to be tachypneic with respiratory rate in 30s, hypoxic, placed on O2 via nasal cannula, O2 sats improved to 92%  Hospital Course:     Klebsiella pneumoniae bacteremia Blood culture grew gram-negative bacteria, patient is on Zosyn. Patient reported that he urinate once may be every 1-2 days.  Has very benign abdominal exam, normal LFTs, probably the source is not intra-abdominal infection. Continue current antibiotics, adjust antibiotics according to culture results. Discharged on Ancef for total of 2 weeks. Likely the source is urinary tract.  Acute respiratory failure with hypoxia This is secondary to noncardiogenic pulmonary edema from missing hemodialysis. Patient probably missed hemodialysis because of being sick secondary to the bacteremia. Dialyzed yesterday, his breathing is back to his baseline, SOB and wheezing resolved. This is resolved after dialysis.  Diarrhea with generalized weakness Report multiple PMNs per day, 1 loose bowel movement since yesterday. Recently C. difficile was negative, recheck C. difficile and GI pathogen panel. CT scan of abdomen pelvis did not show colitis,  Flagyl discontinued. Patient had recent C. difficile colitis infection in June 2015, discharged on Florastor as well.  Fever Temperature up to 102.5, no marked leukocytosis or x-ray findings. Blood culture  grew Klebsiella pneumoniae .  ESRD Nephrology consulted, continue dialysis. Missed dialysis once last Tuesday 11/24.  Chronic systolic CHF 2-D echocardiogram in October 2014 showed the EF of 40-45%. Current pulmonary edema and hypotension is secondary to hypervolemia from missing dialysis. Continue metoprolol and aspirin.  Diabetes mellitus type 2 Insulin-dependent diabetes mellitus, his home dose of insulin restarted at discharge. Hemoglobin A1c is 7.3.  Procedures:  None  Consultations:  Nephrology  Discharge Exam: Filed Vitals:   08/25/14 0443  BP: 91/39  Pulse: 64  Temp: 99  F (37.2 C)  Resp: 20   General: Alert and awake, oriented x3, not in any acute distress. HEENT: anicteric sclera, pupils  reactive to light and accommodation, EOMI CVS: S1-S2 clear, no murmur rubs or gallops Chest: clear to auscultation bilaterally, no wheezing, rales or rhonchi Abdomen: soft nontender, nondistended, normal bowel sounds, no organomegaly Extremities: no cyanosis, clubbing or edema noted bilaterally Neuro: Cranial nerves II-XII intact, no focal neurological deficits   Discharge Instructions You were cared for by a hospitalist during your hospital stay. If you have any questions about your discharge medications or the care you received while you were in the hospital after you are discharged, you can call the unit and asked to speak with the hospitalist on call if the hospitalist that took care of you is not available. Once you are discharged, your primary care physician will handle any further medical issues. Please note that NO REFILLS for any discharge medications will be authorized once you are discharged, as it is imperative that you return to your primary care physician (or establish a relationship with a primary care physician if you do not have one) for your aftercare needs so that they can reassess your need for medications and monitor your lab values.  Discharge Instructions    Diet - low sodium heart healthy    Complete by:  As directed      Increase activity slowly    Complete by:  As directed           Current Discharge Medication List    START taking these medications   Details  ceFAZolin 2 g in dextrose 5 % 50 mL ivpb Inject 2 g into the vein one time in dialysis. For 2 weeks, end date is Sunday 09/08/14 Qty: 2 mL, Refills: 0    saccharomyces boulardii (FLORASTOR) 250 MG capsule Take 1 capsule (250 mg total) by mouth 2 (two) times daily. Qty: 60 capsule, Refills: 0      CONTINUE these medications which have NOT CHANGED   Details  aspirin 81 MG tablet Take 81 mg by mouth daily.    B Complex-C-Folic Acid (NEPHROCAPS PO) Take 1 capsule by mouth daily.    ferrous sulfate 325  (65 FE) MG tablet Take 325 mg by mouth 2 (two) times daily.    insulin regular (NOVOLIN R,HUMULIN R) 100 units/mL injection Inject 2 Units into the skin daily after supper.    loperamide (IMODIUM) 2 MG capsule Take 1 capsule (2 mg total) by mouth 4 (four) times daily as needed for diarrhea or loose stools. Qty: 12 capsule, Refills: 0    metoprolol (LOPRESSOR) 50 MG tablet Take 50 mg by mouth 2 (two) times daily.    Multiple Vitamins-Minerals (CENTRUM SILVER PO) Take 1 capsule by mouth daily.     sevelamer carbonate (RENVELA) 800 MG tablet Take 1,600 mg by mouth daily.       STOP taking these medications     metroNIDAZOLE (FLAGYL) 500 MG tablet        Allergies  Allergen Reactions  . Statins Cough   Follow-up Information    Follow up with  Melinda Crutch, MD In 1 week.   Specialty:  Family Medicine   Contact information:   [address] [place] [address] [phone]        The results of significant diagnostics from this hospitalization (including imaging, microbiology, ancillary and laboratory) are listed below for reference.    Significant Diagnostic Studies: Ct Abdomen Pelvis Wo Contrast  08/22/2014   CLINICAL DATA:  Family reports patient is been lethargic for the past week. Recent diagnosis of pneumonia. Diarrhea. Missed dialysis due to weakness.  EXAM: CT ABDOMEN AND PELVIS WITHOUT CONTRAST  TECHNIQUE: Multidetector CT imaging of the abdomen and pelvis was performed following the standard protocol without IV contrast.  COMPARISON:  03/27/2014  FINDINGS: Lower chest: There is moderate cardiac enlargement. Left chest wall AICD lead is identified within the right ventricle. Calcifications within the RCA and left circumflex coronary arteries are noted. Small bilateral pleural effusions noted.  Hepatobiliary: No focal liver abnormality. Gallbladder sludge identified. No biliary dilatation.  Pancreas: The pancreas appears normal.  Spleen: Normal appearance of the spleen.   Adrenals/Urinary Tract: Normal adrenal glands. Atrophy of the left kidney. Asymmetric right-sided nephromegaly and hydronephrosis and hydroureter noted. Increased attenuation within the dilated right ureter is noted up to the level of the urinary bladder. There is increased attenuation within the lumen of the bladder. Partially collapsed bladder exhibits mild bladder wall thickening.  Stomach/Bowel: The stomach is normal. The small bowel loops have a normal course and caliber. Unremarkable appearance of the colon.  Vascular/Lymphatic: Calcified atherosclerotic disease involves the abdominal aorta. No aneurysm. No enlarged retroperitoneal or mesenteric adenopathy. No enlarged pelvic or inguinal lymph nodes.  Reproductive: Prostate gland enlargement noted.  Other: Patient has a peritoneal dialysis catheter. The tip of the catheter is in the right lower quadrant of the abdomen. There is a moderate amount of ascites identified within the abdomen and pelvis.  Musculoskeletal: Changes of chronic renal osteodystrophy identified. Advanced degenerative disc disease noted at L4-5 and L5-S1. Facet hypertrophy and degenerative change noted bilaterally.  IMPRESSION: 1. Pleural effusions and body wall edema noted consistent with a fluid overload state perhaps due to missing dialysis. 2. Peritoneal dialysis catheter is in place and there is a moderate amount of fluid within the abdomen and pelvis. Likely dialysate. 3. Right-sided hydronephrosis and hydroureter. Intermediate attenuating fluid within the right renal collecting system, ureter and bladder which may reflect blood clot. Underlying urothelial lesion cannot be excluded. No stone noted. Recommend followup with hematuria protocol CT when the patient is able to be dialyzed appropriately.   Electronically Signed   By: Kerby Moors M.D.   On: 08/22/2014 02:36   Dg Chest 2 View  08/21/2014   CLINICAL DATA:  Shortness of breath and weakness  EXAM: CHEST  2 VIEW   COMPARISON:  08/16/2014  FINDINGS: Cardiac shadow remains enlarged. A defibrillator is again seen. The cephalization seen previously has resolved. No focal infiltrate or sizable effusion is seen. No acute bony abnormality is noted.  IMPRESSION: Stable cardiomegaly.  No acute abnormality seen.   Electronically Signed   By: Inez Catalina M.D.   On: 08/21/2014 10:28   Dg Chest 2 View  08/16/2014   CLINICAL DATA:  Diarrhea.  Weakness.  Shortness of breath.  EXAM: CHEST  2 VIEW  COMPARISON:  06/05/2014  FINDINGS: AICD noted with moderate enlargement of the cardiopericardial silhouette. No edema. Mild cephalization of blood flow.  Mild blunting of both posterior costophrenic angles.  IMPRESSION: 1. Cardiomegaly with mild cephalization of blood flow which may reflect pulmonary venous hypertension. No overt edema. 2. Small bilateral pleural effusions.   Electronically Signed   By: Sherryl Barters M.D.   On: 08/16/2014 14:06    Microbiology: Recent Results (from the past 240 hour(s))  Clostridium Difficile by PCR     Status: None   Collection Time: 08/16/14  5:16 PM  Result Value Ref Range Status   C difficile by pcr NEGATIVE NEGATIVE Final  Culture, blood (routine x 2)     Status: None   Collection Time: 08/21/14  7:50 PM  Result Value Ref Range Status   Specimen Description BLOOD RIGHT HAND  Final   Special Requests BOTTLES DRAWN AEROBIC ONLY 5CC  Final   Culture  Setup Time   Final    08/22/2014 01:53 Performed at [facility]    Culture   Final    KLEBSIELLA PNEUMONIAE Note: SUSCEPTIBILITIES PERFORMED ON PREVIOUS CULTURE WITHIN THE LAST 5 DAYS. Note: Gram Stain Report Called to,Read Back By and Verified With: REBECCA QIU ON 08/22/2014 AT 11:04P BY WILEJ Performed at [facility]    Report Status 08/24/2014 FINAL  Final  Culture, blood (routine x 2)     Status: None   Collection Time: 08/21/14  7:57 PM  Result Value Ref Range Status   Specimen Description BLOOD RIGHT HAND   Final   Special Requests BOTTLES DRAWN AEROBIC ONLY 5CC  Final   Culture  Setup Time   Final    08/22/2014 01:44 Performed at [facility]    Culture   Final    KLEBSIELLA PNEUMONIAE Note: Gram Stain Report Called to,Read Back By and Verified With: REBECCA QIU ON 08/22/2014 AT 11:04P BY WILEJ Performed at [facility]    Report Status 08/24/2014 FINAL  Final   Organism ID, Bacteria KLEBSIELLA PNEUMONIAE  Final      Susceptibility   Klebsiella pneumoniae - MIC*    AMPICILLIN >=32 RESISTANT Resistant     AMPICILLIN/SULBACTAM 4 SENSITIVE Sensitive     CEFAZOLIN <=4 SENSITIVE Sensitive     CEFEPIME <=1 SENSITIVE Sensitive     CEFTAZIDIME <=1 SENSITIVE Sensitive     CEFTRIAXONE <=1 SENSITIVE Sensitive     CIPROFLOXACIN <=0.25 SENSITIVE Sensitive     GENTAMICIN <=1 SENSITIVE Sensitive     IMIPENEM <=0.25 SENSITIVE Sensitive     PIP/TAZO <=4 SENSITIVE Sensitive     TOBRAMYCIN <=1 SENSITIVE Sensitive     TRIMETH/SULFA <=20 SENSITIVE Sensitive     * KLEBSIELLA PNEUMONIAE  Clostridium Difficile by PCR     Status: None   Collection Time: 08/22/14 10:10 PM  Result Value Ref Range Status   C difficile by pcr NEGATIVE NEGATIVE Final     Labs: Basic Metabolic Panel:  Recent Labs Lab 08/21/14 0930 08/22/14 0421 08/23/14 0714 08/24/14 0941  NA 135* 133* 132* 134*  K 5.0 5.1 4.7 4.1  CL 88* 90* 88* 91*  CO2 28 22 25 25   GLUCOSE 217* 191* 288* 266*  BUN 43* 37* 54* 40*  CREATININE 8.11* 7.31* 8.65* 6.93*  CALCIUM 9.8 9.1 9.1 8.9  PHOS  --   --  4.2 3.5   Liver Function Tests:  Recent Labs Lab 08/21/14 0930 08/23/14 0714 08/24/14 0941  AST 32  --   --   ALT 14  --   --   ALKPHOS 77  --   --   BILITOT 3.3*  --   --   PROT 7.9  --   --   ALBUMIN 3.1* 2.8* 2.6*   No results for input(s): LIPASE, AMYLASE in the last 168 hours. No results for input(s): AMMONIA in the last 168 hours. CBC:  Recent Labs Lab 08/21/14 0930 08/22/14 0421 08/23/14 0435  08/24/14 0941  WBC 10.0 8.7 9.1 7.7  NEUTROABS 7.2  --   --   --  HGB 13.7 12.6* 11.9* 11.7*  HCT 38.5* 36.5* 35.4* 32.4*  MCV 93.2 91.7 96.2 90.8  PLT 169 178 327 182   Cardiac Enzymes: No results for input(s): CKTOTAL, CKMB, CKMBINDEX, TROPONINI in the last 168 hours. BNP: BNP (last 3 results)  Recent Labs  08/21/14 0930  PROBNP >70000.0*   CBG:  Recent Labs Lab 08/24/14 0718 08/24/14 1333 08/24/14 1627 08/24/14 1955 08/25/14 0737  GLUCAP 235* 175* 261* 273* 234*       Signed:  Seville Downs A  [facility] 08/25/2014, 10:12 AM

## 2014-08-25 NOTE — Progress Notes (Signed)
[facility] ROUNDING NOTE   Subjective:   Interval History:  No complaints this AM  Mentally improved  Klebsiella bacteremia sensitive to Ancef  Objective:  Vital signs in last 24 hours:  Temp:  [97.5 F (36.4 C)-99 F (37.2 C)] 99 F (37.2 C) (11/29 0443) Pulse Rate:  [56-68] 64 (11/29 0443) Resp:  [18-20] 20 (11/29 0443) BP: (91-125)/(39-62) 91/39 mmHg (11/29 0443) SpO2:  [95 %-100 %] 96 % (11/29 0443) Weight:  [118.1 kg (260 lb 5.8 oz)-119 kg (262 lb 5.6 oz)] 119 kg (262 lb 5.6 oz) (11/29 0154)  Weight change: 0.3 kg (10.6 oz) Filed Weights   08/24/14 0925 08/24/14 1235 08/25/14 0154  Weight: 118.1 kg (260 lb 5.8 oz) 118.1 kg (260 lb 5.8 oz) 119 kg (262 lb 5.6 oz)    Intake/Output: I/O last 3 completed shifts: In: 900 [P.O.:900] Out: 1 [Stool:1]   Intake/Output this shift:     CVS- RRR RS- CTA ABD- BS present soft non-distended EXT- no edema   Basic Metabolic Panel:  Recent Labs Lab 08/21/14 0930 08/22/14 0421 08/23/14 0714 08/24/14 0941  NA 135* 133* 132* 134*  K 5.0 5.1 4.7 4.1  CL 88* 90* 88* 91*  CO2 28 22 25 25   GLUCOSE 217* 191* 288* 266*  BUN 43* 37* 54* 40*  CREATININE 8.11* 7.31* 8.65* 6.93*  CALCIUM 9.8 9.1 9.1 8.9  PHOS  --   --  4.2 3.5    Liver Function Tests:  Recent Labs Lab 08/21/14 0930 08/23/14 0714 08/24/14 0941  AST 32  --   --   ALT 14  --   --   ALKPHOS 77  --   --   BILITOT 3.3*  --   --   PROT 7.9  --   --   ALBUMIN 3.1* 2.8* 2.6*   No results for input(s): LIPASE, AMYLASE in the last 168 hours. No results for input(s): AMMONIA in the last 168 hours.  CBC:  Recent Labs Lab 08/21/14 0930 08/22/14 0421 08/23/14 0435 08/24/14 0941  WBC 10.0 8.7 9.1 7.7  NEUTROABS 7.2  --   --   --   HGB 13.7 12.6* 11.9* 11.7*  HCT 38.5* 36.5* 35.4* 32.4*  MCV 93.2 91.7 96.2 90.8  PLT 169 178 327 182    Cardiac Enzymes: No results for input(s): CKTOTAL, CKMB, CKMBINDEX, TROPONINI in the last 168  hours.  BNP: Invalid input(s): POCBNP  CBG:  Recent Labs Lab 08/24/14 0718 08/24/14 1333 08/24/14 1627 08/24/14 1955 08/25/14 0737  GLUCAP 235* 175* 261* 273* 62*    Microbiology: Results for orders placed or performed during the hospital encounter of 08/21/14  Culture, blood (routine x 2)     Status: None   Collection Time: 08/21/14  7:50 PM  Result Value Ref Range Status   Specimen Description BLOOD RIGHT HAND  Final   Special Requests BOTTLES DRAWN AEROBIC ONLY 5CC  Final   Culture  Setup Time   Final    08/22/2014 01:53 Performed at [facility]    Culture   Final    KLEBSIELLA PNEUMONIAE Note: SUSCEPTIBILITIES PERFORMED ON PREVIOUS CULTURE WITHIN THE LAST 5 DAYS. Note: Gram Stain Report Called to,Read Back By and Verified With: REBECCA QIU ON 08/22/2014 AT 11:04P BY WILEJ Performed at [facility]    Report Status 08/24/2014 FINAL  Final  Culture, blood (routine x 2)     Status: None   Collection Time: 08/21/14  7:57 PM  Result Value  Ref Range Status   Specimen Description BLOOD RIGHT HAND  Final   Special Requests BOTTLES DRAWN AEROBIC ONLY 5CC  Final   Culture  Setup Time   Final    08/22/2014 01:44 Performed at [place] Note: Gram Stain Report Called to,Read Back By and Verified With: REBECCA QIU ON 08/22/2014 AT 11:04P BY WILEJ Performed at [facility]    Report Status 08/24/2014 FINAL  Final   Organism ID, Bacteria KLEBSIELLA PNEUMONIAE  Final      Susceptibility   Klebsiella pneumoniae - MIC*    AMPICILLIN >=32 RESISTANT Resistant     AMPICILLIN/SULBACTAM 4 SENSITIVE Sensitive     CEFAZOLIN <=4 SENSITIVE Sensitive     CEFEPIME <=1 SENSITIVE Sensitive     CEFTAZIDIME <=1 SENSITIVE Sensitive     CEFTRIAXONE <=1 SENSITIVE Sensitive     CIPROFLOXACIN <=0.25 SENSITIVE Sensitive     GENTAMICIN <=1 SENSITIVE Sensitive     IMIPENEM <=0.25 SENSITIVE Sensitive     PIP/TAZO  <=4 SENSITIVE Sensitive     TOBRAMYCIN <=1 SENSITIVE Sensitive     TRIMETH/SULFA <=20 SENSITIVE Sensitive     * KLEBSIELLA PNEUMONIAE  Clostridium Difficile by PCR     Status: None   Collection Time: 08/22/14 10:10 PM  Result Value Ref Range Status   C difficile by pcr NEGATIVE NEGATIVE Final    Coagulation Studies: No results for input(s): LABPROT, INR in the last 72 hours.  Urinalysis: No results for input(s): COLORURINE, LABSPEC, PHURINE, GLUCOSEU, HGBUR, BILIRUBINUR, KETONESUR, PROTEINUR, UROBILINOGEN, NITRITE, LEUKOCYTESUR in the last 72 hours.  Invalid input(s): APPERANCEUR    Imaging: No results found.   Medications:     . antiseptic oral rinse  7 mL Mouth Rinse BID  . aspirin  81 mg Oral Daily  . enoxaparin (LOVENOX) injection  30 mg Subcutaneous Q24H  . ferrous sulfate  325 mg Oral BID  . insulin aspart  0-9 Units Subcutaneous TID WC  . metoprolol  50 mg Oral BID  . piperacillin-tazobactam (ZOSYN)  IV  2.25 g Intravenous Q8H  . sevelamer carbonate  1,600 mg Oral Daily  . 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

## 2014-08-25 NOTE — Plan of Care (Signed)
Problem: Phase III Progression Outcomes Goal: Pain controlled on oral analgesia Outcome: Completed/Met Date Met:  08/25/14 Goal: Voiding independently Outcome: Completed/Met Date Met:  08/25/14 Goal: Other Phase III Outcomes/Goals Outcome: Not Applicable Date Met:  24/17/53

## 2014-08-25 NOTE — Plan of Care (Signed)
Problem: Consults Goal: General Medical Patient Education See Patient Education Module for specific education.  Outcome: Completed/Met Date Met:  08/25/14 Goal: Skin Care Protocol Initiated - if Braden Score 18 or less If consults are not indicated, leave blank or document N/A  Outcome: Not Applicable Date Met:  88/11/03 Goal: Nutrition Consult-if indicated Outcome: Not Applicable Date Met:  15/94/58 Goal: Diabetes Guidelines if Diabetic/Glucose > 140 If diabetic or lab glucose is > 140 mg/dl - Initiate Diabetes/Hyperglycemia Guidelines & Document Interventions  Outcome: Completed/Met Date Met:  08/25/14

## 2014-08-25 NOTE — Evaluation (Signed)
Physical Therapy Evaluation Patient Details Name: Ryan Campbell MRN: [id number] DOB: 06/30/1941 Today's Date: 08/25/2014   History of Present Illness  Patient is a 73 year old male with ESRD on HD, TTS, diabetes, hypertension, chronic systolic and diastolic CHF, EF 53%, brought to ER by his wife for shortness of breath and diarrhea  Clinical Impression  Pt is not fully steady, but with his cane or RW in should be safe in a home-like environment.  He would benefit from HHPT to build strength and recondintion.    Follow Up Recommendations Home health PT    Equipment Recommendations  None recommended by PT    Recommendations for Other Services       Precautions / Restrictions Precautions Precautions: Fall Restrictions Weight Bearing Restrictions: No      Mobility  Bed Mobility               General bed mobility comments: pt in chair  Transfers Overall transfer level: Needs assistance Equipment used: 1 person hand held assist Transfers: Sit to/from Stand Sit to Stand: Min guard         General transfer comment: Pt needed VC to push up with arms, as well as widen BOS.  Pt frequently reaching out for furniture and doorframes to help him up.  Ambulation/Gait Ambulation/Gait assistance: Min guard Ambulation Distance (Feet): 90 Feet (with 3 standing rest breaks) Assistive device: Rolling walker (2 wheeled) (or rail) Gait Pattern/deviations: Step-through pattern;Decreased step length - right;Decreased step length - left;Decreased stride length;Narrow base of support Gait velocity: slower   General Gait Details: unsteady throughout, with narrowed BOS, mild scissoring to maintain balance.  Stairs Stairs: Yes Stairs assistance: Min guard Stair Management: Two rails;Alternating pattern;Forwards Number of Stairs: 2 General stair comments: heavy use of rails, but safe enough  Wheelchair Mobility    Modified Rankin (Stroke Patients Only)       Balance Overall  balance assessment: Needs assistance Sitting-balance support: No upper extremity supported Sitting balance-Leahy Scale: Good       Standing balance-Leahy Scale: Fair                               Pertinent Vitals/Pain Pain Assessment: No/denies pain    Home Living Family/patient expects to be discharged to:: Private residence Living Arrangements: Spouse/significant other Available Help at Discharge: Family (wife is in and out) Type of Home: House Home Access: Stairs to enter   Technical brewer of Steps: 5 Home Layout: Two level Home Equipment: Environmental consultant - 2 wheels      Prior Function Level of Independence: Independent with assistive device(s)               Hand Dominance        Extremity/Trunk Assessment   Upper Extremity Assessment: Defer to OT evaluation           Lower Extremity Assessment: Overall WFL for tasks assessed;Generalized weakness (R proximally mildly weak)         Communication   Communication: No difficulties  Cognition Arousal/Alertness: Awake/alert Behavior During Therapy: WFL for tasks assessed/performed Overall Cognitive Status: No family/caregiver present to determine baseline cognitive functioning                      General Comments General comments (skin integrity, edema, etc.): pt expressing back pain in the paraspinals as one reason he can't do much    Exercises  Assessment/Plan    PT Assessment All further PT needs can be met in the next venue of care  PT Diagnosis Difficulty walking;Generalized weakness   PT Problem List Decreased strength;Decreased activity tolerance;Decreased mobility;Decreased knowledge of use of DME;Cardiopulmonary status limiting activity;Pain  PT Treatment Interventions     PT Goals (Current goals can be found in the Care Plan section) Acute Rehab PT Goals Patient Stated Goal: go home today PT Goal Formulation: All assessment and education complete, DC  therapy    Frequency     Barriers to discharge        Co-evaluation               End of Session   Activity Tolerance: Patient tolerated treatment well Patient left: in chair;with call bell/phone within reach Nurse Communication: Mobility status         Time: 1210-1238 PT Time Calculation (min) (ACUTE ONLY): 28 min   Charges:   PT Evaluation $Initial PT Evaluation Tier I: 1 Procedure PT Treatments $Gait Training: 8-22 mins   PT G Codes:          Loran Fleet, Tessie Fass 08/25/2014, 12:56 PM 08/25/2014  Donnella Sham, PT [phone]  (pager)

## 2014-08-25 NOTE — Plan of Care (Signed)
Problem: Phase II Progression Outcomes Goal: Discharge plan established Outcome: Completed/Met Date Met:  08/25/14  Problem: Phase III Progression Outcomes Goal: Activity at appropriate level-compared to baseline (UP IN CHAIR FOR HEMODIALYSIS)  Outcome: Completed/Met Date Met:  08/25/14 Goal: IV/normal saline lock discontinued Outcome: Completed/Met Date Met:  08/25/14 Goal: Discharge plan remains appropriate-arrangements made Outcome: Completed/Met Date Met:  08/25/14

## 2014-08-25 NOTE — Progress Notes (Signed)
Patient Discharge: Disposition: Patient discharged home. Education: Patient educated about follow-up appointments, medications, prescriptions, and discharge instructions. IV: Discontinued IV before discharge. Transportation: Patient transported in w/c with staff accompanying to the parking lot. Belongings: Patient took all his belongings with him.

## 2014-08-25 NOTE — Progress Notes (Signed)
Patient Discharge:  Disposition: Patient discharged home. Education: Patient educated about medications, prescriptions, discharge instructions, and follow-up appointments. IV: Discontinued IV before discharge. Transportation: Patient transported in w/c with the staff accompanying to the parking lot. Belongings: Patient took all his belongings with him

## 2014-08-25 NOTE — Plan of Care (Signed)
Problem: Phase I Progression Outcomes Goal: Initial discharge plan identified Outcome: Completed/Met Date Met:  08/25/14 Goal: Hemodynamically stable Outcome: Completed/Met Date Met:  08/25/14 Goal: Other Phase I Outcomes/Goals Outcome: Not Applicable Date Met:  68/59/92  Problem: Phase II Progression Outcomes Goal: Vital signs remain stable Outcome: Completed/Met Date Met:  08/25/14 Goal: Other Phase II Outcomes/Goals Outcome: Not Applicable Date Met:  34/14/43

## 2014-08-25 NOTE — Plan of Care (Signed)
Problem: Phase I Progression Outcomes Goal: Pain controlled with appropriate interventions Outcome: Completed/Met Date Met:  08/25/14  Problem: Phase II Progression Outcomes Goal: Progress activity as tolerated unless otherwise ordered Outcome: Completed/Met Date Met:  08/25/14 Goal: IV changed to normal saline lock Outcome: Completed/Met Date Met:  08/25/14 Goal: Obtain order to discontinue catheter if appropriate Outcome: Not Applicable Date Met:  78/93/81

## 2014-08-25 NOTE — Care Management Note (Signed)
    Page 1 of 1   08/25/2014     4:54:56 PM CARE MANAGEMENT NOTE 08/25/2014  Patient:  TEVITA, GOMER   Account Number:  [id number]  Date Initiated:  08/25/2014  Documentation initiated by:  [facility]  Subjective/Objective Assessment:   adm: Acute respiratory failure     Action/Plan:   discharge planning   Anticipated DC Date:  08/25/2014   Anticipated DC Plan:  [place]  CM consult      [facility] Choice  HOME HEALTH   Choice offered to / List presented to:  C-1 Patient        [place] arranged  [place].   Status of service:  Completed, signed off Medicare Important Message given?   (If response is "NO", the following Medicare IM given date fields will be blank) Date Medicare IM given:   Medicare IM given by:   Date Additional Medicare IM given:   Additional Medicare IM given by:    Discharge Disposition:  [place]  Per UR Regulation:    If discussed at Long Length of Stay Meetings, dates discussed:    Comments:  07/2914 10:00 CM met with pt in room to offer choice of home health agency.  Pt chooses [facility] to render HHPT/OT. Address and contact information verified with pt.  Referral called to [facility] rep, Kristen.  No other CM needs were communicated.  Mariane Masters, BSN, CM [phone].

## 2014-09-09 ENCOUNTER — Ambulatory Visit: Payer: Self-pay | Admitting: Internal Medicine

## 2014-09-25 ENCOUNTER — Other Ambulatory Visit: Payer: Self-pay | Admitting: Gastroenterology

## 2014-11-19 ENCOUNTER — Encounter: Payer: Self-pay | Admitting: *Deleted

## 2014-11-25 ENCOUNTER — Other Ambulatory Visit: Payer: Self-pay | Admitting: Gastroenterology

## 2014-12-06 NOTE — Progress Notes (Signed)
Attempts to reach pt by phone unsuccessful x4 attempts- messages left with no return call.

## 2014-12-09 ENCOUNTER — Ambulatory Visit (HOSPITAL_COMMUNITY): Payer: Medicare Other | Admitting: Anesthesiology

## 2014-12-09 ENCOUNTER — Encounter (HOSPITAL_COMMUNITY)
Admission: RE | Disposition: A | Discharge status: Home / Self Care | Payer: Self-pay | Source: Ambulatory Visit | Attending: Gastroenterology

## 2014-12-09 ENCOUNTER — Encounter (HOSPITAL_COMMUNITY): Payer: Self-pay

## 2014-12-09 ENCOUNTER — Ambulatory Visit (HOSPITAL_COMMUNITY)
Admission: RE | Admit: 2014-12-09 | Discharge: 2014-12-09 | Disposition: A | Discharge status: Home / Self Care | Payer: Medicare Other | Source: Ambulatory Visit | Attending: Gastroenterology | Admitting: Gastroenterology

## 2014-12-09 DIAGNOSIS — Z1211 Encounter for screening for malignant neoplasm of colon: Secondary | ICD-10-CM | POA: Diagnosis not present

## 2014-12-09 DIAGNOSIS — Z95810 Presence of automatic (implantable) cardiac defibrillator: Secondary | ICD-10-CM | POA: Diagnosis not present

## 2014-12-09 DIAGNOSIS — N189 Chronic kidney disease, unspecified: Secondary | ICD-10-CM | POA: Insufficient documentation

## 2014-12-09 DIAGNOSIS — E119 Type 2 diabetes mellitus without complications: Secondary | ICD-10-CM | POA: Diagnosis not present

## 2014-12-09 DIAGNOSIS — I129 Hypertensive chronic kidney disease with stage 1 through stage 4 chronic kidney disease, or unspecified chronic kidney disease: Secondary | ICD-10-CM | POA: Insufficient documentation

## 2014-12-09 DIAGNOSIS — Z992 Dependence on renal dialysis: Secondary | ICD-10-CM | POA: Insufficient documentation

## 2014-12-09 DIAGNOSIS — D122 Benign neoplasm of ascending colon: Secondary | ICD-10-CM | POA: Insufficient documentation

## 2014-12-09 DIAGNOSIS — D124 Benign neoplasm of descending colon: Secondary | ICD-10-CM | POA: Diagnosis not present

## 2014-12-09 DIAGNOSIS — I509 Heart failure, unspecified: Secondary | ICD-10-CM | POA: Insufficient documentation

## 2014-12-09 HISTORY — PX: COLONOSCOPY WITH PROPOFOL: SHX5780

## 2014-12-09 LAB — POCT I-STAT 4, (NA,K, GLUC, HGB,HCT)
GLUCOSE: 103 mg/dL — AB (ref 70–99)
HCT: 42 % (ref 39.0–52.0)
Hemoglobin: 14.3 g/dL (ref 13.0–17.0)
Potassium: 4.7 mmol/L (ref 3.5–5.1)
SODIUM: 138 mmol/L (ref 135–145)

## 2014-12-09 LAB — GLUCOSE, CAPILLARY: Glucose-Capillary: 92 mg/dL (ref 70–99)

## 2014-12-09 SURGERY — COLONOSCOPY WITH PROPOFOL
Anesthesia: Monitor Anesthesia Care

## 2014-12-09 MED ORDER — PROMETHAZINE HCL 25 MG/ML IJ SOLN: 6.2500 mg | INTRAMUSCULAR | Status: DC | PRN

## 2014-12-09 MED ORDER — LIDOCAINE HCL (CARDIAC) 20 MG/ML IV SOLN
INTRAVENOUS | Status: AC
  Filled 2014-12-09: qty 5

## 2014-12-09 MED ORDER — PHENYLEPHRINE HCL 10 MG/ML IJ SOLN
INTRAMUSCULAR | Status: DC | PRN
  Administered 2014-12-09: 80 ug via INTRAVENOUS

## 2014-12-09 MED ORDER — SODIUM CHLORIDE 0.9 % IV SOLN
INTRAVENOUS | Status: DC
  Administered 2014-12-09: 500 mL via INTRAVENOUS

## 2014-12-09 MED ORDER — PROPOFOL 10 MG/ML IV BOLUS
INTRAVENOUS | Status: AC
  Filled 2014-12-09: qty 20

## 2014-12-09 MED ORDER — PROPOFOL INFUSION 10 MG/ML OPTIME
INTRAVENOUS | Status: DC | PRN
  Administered 2014-12-09: 300 ug/kg/min via INTRAVENOUS

## 2014-12-09 MED ORDER — KETAMINE HCL 10 MG/ML IJ SOLN
INTRAMUSCULAR | Status: DC | PRN
  Administered 2014-12-09: 20 mg via INTRAVENOUS

## 2014-12-09 MED ORDER — SODIUM CHLORIDE 0.9 % IV SOLN
INTRAVENOUS | Status: DC
  Administered 2014-12-09: 11:00:00 via INTRAVENOUS

## 2014-12-09 SURGICAL SUPPLY — 21 items

## 2014-12-09 NOTE — Transfer of Care (Signed)
Immediate Anesthesia Transfer of Care Note  Patient: Ryan Campbell  Procedure(s) Performed: Procedure(s): COLONOSCOPY WITH PROPOFOL (N/A)  Patient Location: Endo Recovery  Anesthesia Type:MAC  Level of Consciousness: Patient easily awoken, sedated, comfortable, cooperative, following commands, responds to stimulation.   Airway & Oxygen Therapy: Patient spontaneously breathing, ventilating well, oxygen via simple oxygen mask.  Post-op Assessment: Report given to PACU RN, vital signs reviewed and stable, moving all extremities.   Post vital signs: Reviewed and stable.  Complications: No apparent anesthesia complications

## 2014-12-09 NOTE — Anesthesia Postprocedure Evaluation (Signed)
Anesthesia Post Note  Patient: Ryan Campbell  Procedure(s) Performed: Procedure(s) (LRB): COLONOSCOPY WITH PROPOFOL (N/A)  Anesthesia type: MAC  Patient location: PACU  Post pain: Pain level controlled  Post assessment: Patient's Cardiovascular Status Stable  Last Vitals:  Filed Vitals:   12/09/14 1120  BP: 89/39  Pulse: 60  Temp:   Resp: 20    Post vital signs: Reviewed and stable  Level of consciousness: sedated  Complications: No apparent anesthesia complications

## 2014-12-09 NOTE — Op Note (Signed)
Procedure: Baseline screening colonoscopy. Chronic hemodialysis. St. Jude medical cardiac defibrillator inserted 2011.  Endoscopist: Earle Gell  Premedication: Propofol administered by anesthesia  Procedure: The patient was placed in the left lateral decubitus position. Anal inspection and digital rectal exam were normal. The Pentax pediatric colonoscope was introduced into the rectum and advanced to the cecum. A normal-appearing appendiceal orifice was identified. A normal-appearing ileocecal valve was identified. Colonic preparation for the exam today was good. Withdrawal time was 15 minutes  Rectum. Normal. Retroflexed view of the distal rectum normal  Sigmoid colon. Normal.  Descending colon. Two 5 mm sized sessile polyps were removed from the descending colon with the cold snare  Splenic flexure. Normal  Transverse colon. Normal  Hepatic flexure. Normal  Ascending colon. Two 5 mm sized sessile polyps were removed from the ascending colon with the cold snare  Cecum and ileocecal valve. Normal  Assessment: Two small sessile polyps were removed from the descending colon and two small sessile polyps were removed from the ascending colon.  Recommendation: If the polyps return adenomatous pathologically, the patient should undergo a surveillance colonoscopy in 5 years. If the polyps returned nonneoplastic pathologically, he should undergo a repeat screening colonoscopy in 10 years

## 2014-12-09 NOTE — Anesthesia Preprocedure Evaluation (Addendum)
Anesthesia Evaluation  Patient identified by MRN, date of birth, ID band Patient awake    Reviewed: Allergy & Precautions, NPO status   History of Anesthesia Complications Negative for: history of anesthetic complications  Airway Mallampati: II  TM Distance: >3 FB Neck ROM: Full    Dental  (+) Teeth Intact, Dental Advisory Given   Pulmonary neg pulmonary ROS,    Pulmonary exam normal       Cardiovascular hypertension, +CHF + Cardiac Defibrillator     Neuro/Psych negative neurological ROS  negative psych ROS   GI/Hepatic negative GI ROS, Neg liver ROS,   Endo/Other  diabetes  Renal/GU Dialysis and ESRFRenal disease     Musculoskeletal   Abdominal   Peds  Hematology   Anesthesia Other Findings   Reproductive/Obstetrics                            Anesthesia Physical Anesthesia Plan  ASA: III  Anesthesia Plan: MAC   Post-op Pain Management:    Induction: Intravenous  Airway Management Planned: Simple Face Mask  Additional Equipment:   Intra-op Plan:   Post-operative Plan:   Informed Consent: I have reviewed the patients History and Physical, chart, labs and discussed the procedure including the risks, benefits and alternatives for the proposed anesthesia with the patient or authorized representative who has indicated his/her understanding and acceptance.   Dental advisory given  Plan Discussed with: CRNA, Anesthesiologist and Surgeon  Anesthesia Plan Comments:        Anesthesia Quick Evaluation

## 2014-12-09 NOTE — Discharge Instructions (Signed)

## 2014-12-09 NOTE — H&P (Signed)
Procedure: Baseline screening colonoscopy. Chronic hemodialysis. Cardiac defibrillator.  History: The patient is a 74 year old male born 1940/10/11. He is scheduled to undergo his first screening colonoscopy with polypectomy to prevent colon cancer.  Past medical history: Chronic kidney disease requiring hemodialysis. Cardiac defibrillator implanted in 2011. Type 2 diabetes mellitus. Hypertension. Bilateral cataract surgery. Laser retinal surgery.  Medication allergies: Statin drugs  Exam: The patient is alert and lying comfortably on the endoscopy stretcher. Abdomen is soft and nontender to palpation. Lungs are clear to auscultation. Cardiac exam reveals a regular rhythm.  Plan: Proceed with baseline screening colonoscopy

## 2014-12-10 ENCOUNTER — Encounter (HOSPITAL_COMMUNITY): Payer: Self-pay | Admitting: Gastroenterology

## 2014-12-29 ENCOUNTER — Encounter (HOSPITAL_COMMUNITY)
Admission: EM | Disposition: E | Payer: Federal, State, Local not specified - PPO | Source: Home / Self Care | Attending: Pulmonary Disease

## 2014-12-29 ENCOUNTER — Inpatient Hospital Stay (HOSPITAL_COMMUNITY)
Admission: EM | Admit: 2014-12-29 | Discharge: 2015-01-26 | DRG: 286 | Disposition: E | Payer: Medicare Other | Attending: Pulmonary Disease | Admitting: Pulmonary Disease

## 2014-12-29 ENCOUNTER — Other Ambulatory Visit: Payer: Self-pay

## 2014-12-29 ENCOUNTER — Encounter (HOSPITAL_COMMUNITY): Admission: EM | Disposition: E | Payer: Self-pay | Source: Home / Self Care | Attending: Pulmonary Disease

## 2014-12-29 ENCOUNTER — Emergency Department (HOSPITAL_COMMUNITY): Payer: Medicare Other

## 2014-12-29 ENCOUNTER — Inpatient Hospital Stay (HOSPITAL_COMMUNITY): Payer: Medicare Other

## 2014-12-29 ENCOUNTER — Encounter (HOSPITAL_COMMUNITY): Payer: Self-pay | Admitting: Cardiology

## 2014-12-29 DIAGNOSIS — I5022 Chronic systolic (congestive) heart failure: Secondary | ICD-10-CM | POA: Diagnosis present

## 2014-12-29 DIAGNOSIS — Z7982 Long term (current) use of aspirin: Secondary | ICD-10-CM | POA: Diagnosis not present

## 2014-12-29 DIAGNOSIS — I469 Cardiac arrest, cause unspecified: Secondary | ICD-10-CM

## 2014-12-29 DIAGNOSIS — Z66 Do not resuscitate: Secondary | ICD-10-CM | POA: Diagnosis present

## 2014-12-29 DIAGNOSIS — S0990XA Unspecified injury of head, initial encounter: Secondary | ICD-10-CM

## 2014-12-29 DIAGNOSIS — I2582 Chronic total occlusion of coronary artery: Secondary | ICD-10-CM | POA: Diagnosis present

## 2014-12-29 DIAGNOSIS — R4020 Unspecified coma: Secondary | ICD-10-CM | POA: Diagnosis present

## 2014-12-29 DIAGNOSIS — W1830XA Fall on same level, unspecified, initial encounter: Secondary | ICD-10-CM | POA: Diagnosis present

## 2014-12-29 DIAGNOSIS — R570 Cardiogenic shock: Secondary | ICD-10-CM | POA: Diagnosis present

## 2014-12-29 DIAGNOSIS — I4901 Ventricular fibrillation: Principal | ICD-10-CM | POA: Diagnosis present

## 2014-12-29 DIAGNOSIS — E1122 Type 2 diabetes mellitus with diabetic chronic kidney disease: Secondary | ICD-10-CM | POA: Diagnosis present

## 2014-12-29 DIAGNOSIS — R55 Syncope and collapse: Secondary | ICD-10-CM

## 2014-12-29 DIAGNOSIS — G931 Anoxic brain damage, not elsewhere classified: Secondary | ICD-10-CM | POA: Diagnosis present

## 2014-12-29 DIAGNOSIS — I340 Nonrheumatic mitral (valve) insufficiency: Secondary | ICD-10-CM | POA: Diagnosis present

## 2014-12-29 DIAGNOSIS — S0181XA Laceration without foreign body of other part of head, initial encounter: Secondary | ICD-10-CM | POA: Diagnosis present

## 2014-12-29 DIAGNOSIS — Z515 Encounter for palliative care: Secondary | ICD-10-CM | POA: Diagnosis not present

## 2014-12-29 DIAGNOSIS — I4891 Unspecified atrial fibrillation: Secondary | ICD-10-CM | POA: Diagnosis present

## 2014-12-29 DIAGNOSIS — I429 Cardiomyopathy, unspecified: Secondary | ICD-10-CM | POA: Diagnosis not present

## 2014-12-29 DIAGNOSIS — S0191XA Laceration without foreign body of unspecified part of head, initial encounter: Secondary | ICD-10-CM

## 2014-12-29 DIAGNOSIS — J9690 Respiratory failure, unspecified, unspecified whether with hypoxia or hypercapnia: Secondary | ICD-10-CM | POA: Diagnosis present

## 2014-12-29 DIAGNOSIS — I2111 ST elevation (STEMI) myocardial infarction involving right coronary artery: Secondary | ICD-10-CM | POA: Diagnosis not present

## 2014-12-29 DIAGNOSIS — I255 Ischemic cardiomyopathy: Secondary | ICD-10-CM | POA: Diagnosis not present

## 2014-12-29 DIAGNOSIS — I2510 Atherosclerotic heart disease of native coronary artery without angina pectoris: Secondary | ICD-10-CM

## 2014-12-29 DIAGNOSIS — Z79899 Other long term (current) drug therapy: Secondary | ICD-10-CM | POA: Diagnosis not present

## 2014-12-29 DIAGNOSIS — I5023 Acute on chronic systolic (congestive) heart failure: Secondary | ICD-10-CM | POA: Diagnosis present

## 2014-12-29 DIAGNOSIS — Z95810 Presence of automatic (implantable) cardiac defibrillator: Secondary | ICD-10-CM | POA: Diagnosis not present

## 2014-12-29 DIAGNOSIS — I10 Essential (primary) hypertension: Secondary | ICD-10-CM | POA: Diagnosis present

## 2014-12-29 DIAGNOSIS — Z992 Dependence on renal dialysis: Secondary | ICD-10-CM | POA: Diagnosis not present

## 2014-12-29 DIAGNOSIS — I120 Hypertensive chronic kidney disease with stage 5 chronic kidney disease or end stage renal disease: Secondary | ICD-10-CM | POA: Diagnosis present

## 2014-12-29 DIAGNOSIS — N186 End stage renal disease: Secondary | ICD-10-CM | POA: Diagnosis not present

## 2014-12-29 DIAGNOSIS — J9600 Acute respiratory failure, unspecified whether with hypoxia or hypercapnia: Secondary | ICD-10-CM | POA: Diagnosis present

## 2014-12-29 DIAGNOSIS — J9601 Acute respiratory failure with hypoxia: Secondary | ICD-10-CM | POA: Diagnosis not present

## 2014-12-29 DIAGNOSIS — I959 Hypotension, unspecified: Secondary | ICD-10-CM

## 2014-12-29 DIAGNOSIS — E872 Acidosis: Secondary | ICD-10-CM | POA: Diagnosis present

## 2014-12-29 DIAGNOSIS — Z794 Long term (current) use of insulin: Secondary | ICD-10-CM | POA: Diagnosis not present

## 2014-12-29 DIAGNOSIS — I214 Non-ST elevation (NSTEMI) myocardial infarction: Secondary | ICD-10-CM | POA: Diagnosis present

## 2014-12-29 HISTORY — PX: LEFT HEART CATH: SHX5478

## 2014-12-29 LAB — CBC WITH DIFFERENTIAL/PLATELET
BASOS PCT: 1 % (ref 0–1)
Basophils Absolute: 0 10*3/uL (ref 0.0–0.1)
EOS ABS: 0 10*3/uL (ref 0.0–0.7)
Eosinophils Relative: 0 % (ref 0–5)
HEMATOCRIT: 36.6 % — AB (ref 39.0–52.0)
HEMOGLOBIN: 12.1 g/dL — AB (ref 13.0–17.0)
Lymphocytes Relative: 30 % (ref 12–46)
Lymphs Abs: 1.9 10*3/uL (ref 0.7–4.0)
MCH: 30.4 pg (ref 26.0–34.0)
MCHC: 33.1 g/dL (ref 30.0–36.0)
MCV: 92 fL (ref 78.0–100.0)
MONO ABS: 0.8 10*3/uL (ref 0.1–1.0)
MONOS PCT: 12 % (ref 3–12)
Neutro Abs: 3.8 10*3/uL (ref 1.7–7.7)
Neutrophils Relative %: 57 % (ref 43–77)
PLATELETS: 221 10*3/uL (ref 150–400)
RBC: 3.98 MIL/uL — AB (ref 4.22–5.81)
RDW: 15.2 % (ref 11.5–15.5)
WBC: 6.5 10*3/uL (ref 4.0–10.5)

## 2014-12-29 LAB — POCT I-STAT 3, ART BLOOD GAS (G3+)
Acid-Base Excess: 2 mmol/L (ref 0.0–2.0)
BICARBONATE: 28.3 meq/L — AB (ref 20.0–24.0)
O2 Saturation: 98 %
PH ART: 7.362 (ref 7.350–7.450)
PO2 ART: 112 mmHg — AB (ref 80.0–100.0)
TCO2: 30 mmol/L (ref 0–100)
pCO2 arterial: 49.3 mmHg — ABNORMAL HIGH (ref 35.0–45.0)

## 2014-12-29 LAB — MRSA PCR SCREENING: MRSA by PCR: NEGATIVE

## 2014-12-29 LAB — POCT I-STAT, CHEM 8
BUN: 27 mg/dL — AB (ref 6–23)
BUN: 28 mg/dL — ABNORMAL HIGH (ref 6–23)
BUN: 29 mg/dL — ABNORMAL HIGH (ref 6–23)
CHLORIDE: 98 mmol/L (ref 96–112)
CREATININE: 5.6 mg/dL — AB (ref 0.50–1.35)
Calcium, Ion: 1.09 mmol/L — ABNORMAL LOW (ref 1.13–1.30)
Calcium, Ion: 1.04 mmol/L — ABNORMAL LOW (ref 1.13–1.30)
Calcium, Ion: 1.06 mmol/L — ABNORMAL LOW (ref 1.13–1.30)
Chloride: 98 mmol/L (ref 96–112)
Chloride: 99 mmol/L (ref 96–112)
Creatinine, Ser: 5.8 mg/dL — ABNORMAL HIGH (ref 0.50–1.35)
Creatinine, Ser: 6 mg/dL — ABNORMAL HIGH (ref 0.50–1.35)
Glucose, Bld: 86 mg/dL (ref 70–99)
Glucose, Bld: 128 mg/dL — ABNORMAL HIGH (ref 70–99)
Glucose, Bld: 132 mg/dL — ABNORMAL HIGH (ref 70–99)
HCT: 40 % (ref 39.0–52.0)
HCT: 43 % (ref 39.0–52.0)
HCT: 45 % (ref 39.0–52.0)
HEMOGLOBIN: 14.6 g/dL (ref 13.0–17.0)
HEMOGLOBIN: 15.3 g/dL (ref 13.0–17.0)
Hemoglobin: 13.6 g/dL (ref 13.0–17.0)
POTASSIUM: 3.6 mmol/L (ref 3.5–5.1)
Potassium: 3.3 mmol/L — ABNORMAL LOW (ref 3.5–5.1)
Potassium: 3.5 mmol/L (ref 3.5–5.1)
Sodium: 139 mmol/L (ref 135–145)
Sodium: 139 mmol/L (ref 135–145)
Sodium: 138 mmol/L (ref 135–145)
TCO2: 21 mmol/L (ref 0–100)
TCO2: 21 mmol/L (ref 0–100)
TCO2: 21 mmol/L (ref 0–100)

## 2014-12-29 LAB — I-STAT ARTERIAL BLOOD GAS, ED
ACID-BASE DEFICIT: 3 mmol/L — AB (ref 0.0–2.0)
Bicarbonate: 22.2 mEq/L (ref 20.0–24.0)
O2 SAT: 99 %
PCO2 ART: 37.7 mmHg (ref 35.0–45.0)
PH ART: 7.378 (ref 7.350–7.450)
TCO2: 23 mmol/L (ref 0–100)
pO2, Arterial: 145 mmHg — ABNORMAL HIGH (ref 80.0–100.0)

## 2014-12-29 LAB — I-STAT TROPONIN, ED: Troponin i, poc: 0.07 ng/mL (ref 0.00–0.08)

## 2014-12-29 LAB — I-STAT CHEM 8, ED
BUN: 27 mg/dL — AB (ref 6–23)
CHLORIDE: 96 mmol/L (ref 96–112)
CREATININE: 6.2 mg/dL — AB (ref 0.50–1.35)
Calcium, Ion: 1.06 mmol/L — ABNORMAL LOW (ref 1.13–1.30)
Glucose, Bld: 95 mg/dL (ref 70–99)
HCT: 41 % (ref 39.0–52.0)
Hemoglobin: 13.9 g/dL (ref 13.0–17.0)
Potassium: 3.2 mmol/L — ABNORMAL LOW (ref 3.5–5.1)
SODIUM: 138 mmol/L (ref 135–145)
TCO2: 24 mmol/L (ref 0–100)

## 2014-12-29 LAB — COMPREHENSIVE METABOLIC PANEL
ALBUMIN: 3.4 g/dL — AB (ref 3.5–5.2)
ALT: 14 U/L (ref 0–53)
ANION GAP: 18 — AB (ref 5–15)
AST: 34 U/L (ref 0–37)
Alkaline Phosphatase: 83 U/L (ref 39–117)
BUN: 24 mg/dL — ABNORMAL HIGH (ref 6–23)
CALCIUM: 9.2 mg/dL (ref 8.4–10.5)
CO2: 26 mmol/L (ref 19–32)
Chloride: 94 mmol/L — ABNORMAL LOW (ref 96–112)
Creatinine, Ser: 6.54 mg/dL — ABNORMAL HIGH (ref 0.50–1.35)
GFR calc Af Amer: 9 mL/min — ABNORMAL LOW (ref >90)
GFR, EST NON AFRICAN AMERICAN: 8 mL/min — AB (ref >90)
Glucose, Bld: 97 mg/dL (ref 70–99)
POTASSIUM: 3.2 mmol/L — AB (ref 3.5–5.1)
Sodium: 138 mmol/L (ref 135–145)
Total Protein: 8.4 g/dL — ABNORMAL HIGH (ref 6.0–8.3)

## 2014-12-29 LAB — GLUCOSE, CAPILLARY
GLUCOSE-CAPILLARY: 123 mg/dL — AB (ref 70–99)
Glucose-Capillary: 113 mg/dL — ABNORMAL HIGH (ref 70–99)
Glucose-Capillary: 124 mg/dL — ABNORMAL HIGH (ref 70–99)
Glucose-Capillary: 126 mg/dL — ABNORMAL HIGH (ref 70–99)

## 2014-12-29 LAB — I-STAT CG4 LACTIC ACID, ED: LACTIC ACID, VENOUS: 5.53 mmol/L — AB (ref 0.5–2.0)

## 2014-12-29 LAB — PROTIME-INR
INR: 1.6 — AB (ref 0.00–1.49)
Prothrombin Time: 19.2 seconds — ABNORMAL HIGH (ref 11.6–15.2)

## 2014-12-29 LAB — TROPONIN I: Troponin I: 0.1 ng/mL — ABNORMAL HIGH (ref <0.031)

## 2014-12-29 LAB — BRAIN NATRIURETIC PEPTIDE: B Natriuretic Peptide: 2593.1 pg/mL — ABNORMAL HIGH (ref 0.0–100.0)

## 2014-12-29 LAB — APTT: aPTT: 43 seconds — ABNORMAL HIGH (ref 24–37)

## 2014-12-29 SURGERY — LEFT HEART CATH
Anesthesia: LOCAL | Laterality: Right

## 2014-12-29 MED ORDER — SODIUM CHLORIDE 0.9 % IV SOLN: 2000.0000 mL | Freq: Once | INTRAVENOUS | Status: DC

## 2014-12-29 MED ORDER — FENTANYL BOLUS VIA INFUSION
25.0000 ug | INTRAVENOUS | Status: DC | PRN
  Filled 2014-12-29: qty 25

## 2014-12-29 MED ORDER — HEPARIN SODIUM (PORCINE) 5000 UNIT/ML IJ SOLN
5000.0000 [IU] | Freq: Three times a day (TID) | INTRAMUSCULAR | Status: DC
  Filled 2014-12-29: qty 1

## 2014-12-29 MED ORDER — NOREPINEPHRINE BITARTRATE 1 MG/ML IV SOLN
0.0000 ug/min | INTRAVENOUS | Status: DC
  Administered 2014-12-29: 8 ug/min via INTRAVENOUS
  Filled 2014-12-29 (×2): qty 4

## 2014-12-29 MED ORDER — HEPARIN (PORCINE) 2000 UNITS/L FOR CRRT
INTRAVENOUS_CENTRAL | Status: DC | PRN
  Administered 2014-12-30: 02:00:00 via INTRAVENOUS_CENTRAL
  Filled 2014-12-29 (×2): qty 1000

## 2014-12-29 MED ORDER — VASOPRESSIN 20 UNIT/ML IV SOLN
0.0300 [IU]/min | INTRAVENOUS | Status: DC
  Administered 2014-12-29 – 2014-12-31 (×3): 0.03 [IU]/min via INTRAVENOUS
  Filled 2014-12-29 (×3): qty 2

## 2014-12-29 MED ORDER — HEPARIN SODIUM (PORCINE) 5000 UNIT/ML IJ SOLN: 5000.0000 [IU] | Freq: Three times a day (TID) | INTRAMUSCULAR | Status: DC

## 2014-12-29 MED ORDER — CISATRACURIUM BOLUS VIA INFUSION
0.1000 mg/kg | Freq: Once | INTRAVENOUS | Status: AC
  Administered 2014-12-29: 12.7 mg via INTRAVENOUS
  Filled 2014-12-29: qty 13

## 2014-12-29 MED ORDER — ASPIRIN 300 MG RE SUPP
300.0000 mg | RECTAL | Status: AC
  Administered 2014-12-30: 300 mg via RECTAL
  Filled 2014-12-29: qty 1

## 2014-12-29 MED ORDER — PRISMASOL BGK 4/2.5 32-4-2.5 MEQ/L IV SOLN
INTRAVENOUS | Status: DC
  Administered 2014-12-30 – 2015-01-01 (×18): via INTRAVENOUS_CENTRAL
  Filled 2014-12-29 (×24): qty 5000

## 2014-12-29 MED ORDER — ONDANSETRON HCL 4 MG/2ML IJ SOLN: 4.0000 mg | Freq: Four times a day (QID) | INTRAMUSCULAR | Status: DC | PRN

## 2014-12-29 MED ORDER — ACETAMINOPHEN 325 MG PO TABS: 650.0000 mg | ORAL_TABLET | ORAL | Status: DC | PRN

## 2014-12-29 MED ORDER — CHLORHEXIDINE GLUCONATE 0.12 % MT SOLN
15.0000 mL | Freq: Two times a day (BID) | OROMUCOSAL | Status: DC
  Administered 2014-12-29 – 2015-01-01 (×6): 15 mL via OROMUCOSAL
  Filled 2014-12-29 (×5): qty 15

## 2014-12-29 MED ORDER — FENTANYL CITRATE 0.05 MG/ML IJ SOLN: 50.0000 ug | Freq: Once | INTRAMUSCULAR | Status: DC

## 2014-12-29 MED ORDER — VASOPRESSIN 20 UNIT/ML IV SOLN: 0.0300 [IU]/min | INTRAVENOUS | Status: DC

## 2014-12-29 MED ORDER — CETYLPYRIDINIUM CHLORIDE 0.05 % MT LIQD
7.0000 mL | Freq: Four times a day (QID) | OROMUCOSAL | Status: DC
  Administered 2014-12-30 – 2015-01-01 (×11): 7 mL via OROMUCOSAL

## 2014-12-29 MED ORDER — DEXMEDETOMIDINE HCL IN NACL 200 MCG/50ML IV SOLN
0.4000 ug/kg/h | INTRAVENOUS | Status: DC
  Administered 2014-12-29: 0.8 ug/kg/h via INTRAVENOUS
  Administered 2014-12-29: 0.4 ug/kg/h via INTRAVENOUS
  Administered 2014-12-29 – 2014-12-30 (×6): 0.8 ug/kg/h via INTRAVENOUS
  Filled 2014-12-29 (×5): qty 50
  Filled 2014-12-29: qty 100
  Filled 2014-12-29: qty 50

## 2014-12-29 MED ORDER — DEXTROSE 5 % IV SOLN: 0.0000 ug/min | Freq: Once | INTRAVENOUS | Status: DC

## 2014-12-29 MED ORDER — SODIUM CHLORIDE 0.9 % IV SOLN
1.0000 ug/kg/min | INTRAVENOUS | Status: DC
  Administered 2014-12-29 – 2014-12-31 (×3): 1.5 ug/kg/min via INTRAVENOUS
  Filled 2014-12-29 (×4): qty 20

## 2014-12-29 MED ORDER — ARTIFICIAL TEARS OP OINT
1.0000 "application " | TOPICAL_OINTMENT | Freq: Three times a day (TID) | OPHTHALMIC | Status: DC
  Administered 2014-12-29 – 2014-12-31 (×5): 1 via OPHTHALMIC
  Filled 2014-12-29 (×2): qty 3.5

## 2014-12-29 MED ORDER — CISATRACURIUM BOLUS VIA INFUSION
0.0500 mg/kg | INTRAVENOUS | Status: DC | PRN
  Filled 2014-12-29: qty 7

## 2014-12-29 MED ORDER — NOREPINEPHRINE BITARTRATE 1 MG/ML IV SOLN
0.0000 ug/min | INTRAVENOUS | Status: DC
  Administered 2014-12-29: 30 ug/min via INTRAVENOUS
  Administered 2014-12-30 – 2015-01-01 (×8): 50 ug/min via INTRAVENOUS
  Filled 2014-12-29 (×9): qty 16

## 2014-12-29 MED ORDER — PRISMASOL BGK 4/2.5 32-4-2.5 MEQ/L IV SOLN
INTRAVENOUS | Status: DC
  Administered 2014-12-30 – 2015-01-01 (×4): via INTRAVENOUS_CENTRAL
  Filled 2014-12-29 (×4): qty 5000

## 2014-12-29 MED ORDER — SODIUM CHLORIDE 0.9 % IV BOLUS (SEPSIS)
500.0000 mL | Freq: Once | INTRAVENOUS | Status: AC
  Administered 2014-12-29: 500 mL via INTRAVENOUS

## 2014-12-29 MED ORDER — SODIUM CHLORIDE 0.9 % IV SOLN
INTRAVENOUS | Status: DC
  Administered 2014-12-30: 22:00:00 via INTRAVENOUS

## 2014-12-29 MED ORDER — NOREPINEPHRINE BITARTRATE 1 MG/ML IV SOLN
0.0000 ug/min | INTRAVENOUS | Status: DC
  Filled 2014-12-29: qty 4

## 2014-12-29 MED ORDER — PROPOFOL 10 MG/ML IV EMUL
5.0000 ug/kg/min | INTRAVENOUS | Status: DC
  Administered 2014-12-29: 5 ug/kg/min via INTRAVENOUS
  Filled 2014-12-29: qty 100

## 2014-12-29 MED ORDER — PRISMASOL BGK 4/2.5 32-4-2.5 MEQ/L IV SOLN
INTRAVENOUS | Status: DC
  Administered 2014-12-30 – 2015-01-01 (×6): via INTRAVENOUS_CENTRAL
  Filled 2014-12-29 (×8): qty 5000

## 2014-12-29 MED ORDER — ASPIRIN EC 81 MG PO TBEC
81.0000 mg | DELAYED_RELEASE_TABLET | Freq: Every day | ORAL | Status: DC
  Administered 2014-12-30 – 2015-01-01 (×3): 81 mg via ORAL
  Filled 2014-12-29 (×3): qty 1

## 2014-12-29 MED ORDER — SODIUM CHLORIDE 0.9 % IV SOLN
25.0000 ug/h | INTRAVENOUS | Status: DC
  Administered 2014-12-29: 100 ug/h via INTRAVENOUS
  Administered 2014-12-30 (×2): 300 ug/h via INTRAVENOUS
  Administered 2014-12-30: 200 ug/h via INTRAVENOUS
  Administered 2014-12-31 – 2015-01-01 (×3): 300 ug/h via INTRAVENOUS
  Filled 2014-12-29 (×9): qty 50

## 2014-12-29 MED ORDER — HEPARIN SODIUM (PORCINE) 1000 UNIT/ML DIALYSIS
1000.0000 [IU] | INTRAMUSCULAR | Status: DC | PRN
  Administered 2014-12-30: 3000 [IU] via INTRAVENOUS_CENTRAL
  Filled 2014-12-29 (×2): qty 6

## 2014-12-29 MED ORDER — EPINEPHRINE HCL 1 MG/ML IJ SOLN
0.5000 ug/min | INTRAVENOUS | Status: DC
  Administered 2014-12-29: 1 ug/min via INTRAVENOUS
  Administered 2014-12-31: 7 ug/min via INTRAVENOUS
  Administered 2014-12-31: 12 ug/min via INTRAVENOUS
  Filled 2014-12-29 (×4): qty 4

## 2014-12-29 NOTE — H&P (Signed)
Primary cardiologist: Dr Tamala Julian  HPI: 74 year old male with past medical history of defibrillator following syncopal episode (family history of sudden cardiac death), hypertension, end-stage renal disease admitted following cardiac arrest. Patient apparently was at [name] today. He was not complaining of chest pain or dyspnea by report. He had witnessed cardiac arrest. He did not receive CPR for 8 minutes by report. When EMS arrived he was resuscitated within 1-2 minutes. He is now intubated and unresponsive. Electrocardiogram shows probable atrial fibrillation, PVCs or aberrantly conducted beats, RV conduction delay, inferior ST elevation of approximately 1-2 mm. Last echocardiogram in October 2014 showed ejection fraction 40-45%. Possible hypokinesis of the distal inferior wall. Mild mitral regurgitation and trace aortic insufficiency. Mild left atrial enlargement. Moderately elevated pulmonary pressures.   (Not in a hospital admission)  Allergies  Allergen Reactions  . Statins Cough     Past Medical History  Diagnosis Date  . Erectile dysfunction   . HTN (hypertension)   . Cardiovascular collapse 2011  . ESRD on dialysis     "[place]; TTS" (08/21/2014)  . AICD (automatic cardioverter/defibrillator) present 2011    for syncope with FH of sudden death  . Type II diabetes mellitus   . Pneumonia 07/2014    [facility] 08/21/2014  . CHF (congestive heart failure)     Past Surgical History  Procedure Laterality Date  . Ganglion cyst excision Right   . Cataract extraction w/ intraocular lens  implant, bilateral Bilateral 2007  . Cardiac defibrillator placement  2011    St. Jude medical, implanted in [place] for syncope with FH of sudden death  . Shunt externalization  2006  . Carpal tunnel release Left   . Arteriovenous graft placement Left     arm  . Colonoscopy with propofol N/A 12/09/2014    Procedure: COLONOSCOPY WITH PROPOFOL;  Surgeon: Garlan Fair, MD;   Location: [facility] END[facility]COPY;  Service: Endoscopy;  Laterality: N/A;    History   Social History  . Marital Status: Married    Spouse Name: N/A  . Number of Children: 3  . Years of Education: N/A   Occupational History  . retired     Automotive engineer   Social History Main Topics  . Smoking status: Never Smoker   . Smokeless tobacco: Never Used  . Alcohol Use: No  . Drug Use: No  . Sexual Activity: No   Other Topics Concern  . Not on file   Social History Narrative    Family History  Problem Relation Age of Onset  . Diabetes Father     deceased  . Hypertension Father     deceased  . Diabetes Mother     deceased  . Hypertension Mother     deceased  . Coronary artery disease Mother     deceased  . Coronary artery disease Maternal Grandmother   . Diabetes Maternal Grandmother     ROS:  Plan obtainable as patient is intubated and unresponsive.  Physical Exam:   Blood pressure 78/55, pulse 80, resp. rate 15, height 6\' 4"  (1.93 m), weight 280 lb (127.007 kg), SpO2 96 %.  General:  Well developed/well nourished, intubated Skin warm/dry No peripheral clubbing HEENT-small laceration above right eye Neck-normal carotid upstroke bilaterally; no bruits; no JVD; no thyromegaly chest - CTA aanteriorly CV - irregular, no murmurs, distant heart sounds Abdomen -ND, no HSM, no mass, no bruit 2+ femoral pulses, no bruits Ext-trace edema, no chords, diminished distal pulses, chronic skin changes  and ulcer on right lower extremity Neuro-not assessed as patient is unresponsive and intubated.  ECG probable atrial fibrillation with PVCs or aberrantly conducted beats, RV conduction delay, inferior ST elevation.  Results for orders placed or performed during the hospital encounter of 01/17/2015 (from the past 48 hour(s))  CBC with Differential     Status: Abnormal   Collection Time: 01/14/2015  2:53 PM  Result Value Ref Range   WBC 6.5 4.0 - 10.5 K/uL   RBC 3.98 (L) 4.22 -  5.81 MIL/uL   Hemoglobin 12.1 (L) 13.0 - 17.0 g/dL   HCT 36.6 (L) 39.0 - 52.0 %   MCV 92.0 78.0 - 100.0 fL   MCH 30.4 26.0 - 34.0 pg   MCHC 33.1 30.0 - 36.0 g/dL   RDW 15.2 11.5 - 15.5 %   Platelets 221 150 - 400 K/uL   Neutrophils Relative % 57 43 - 77 %   Neutro Abs 3.8 1.7 - 7.7 K/uL   Lymphocytes Relative 30 12 - 46 %   Lymphs Abs 1.9 0.7 - 4.0 K/uL   Monocytes Relative 12 3 - 12 %   Monocytes Absolute 0.8 0.1 - 1.0 K/uL   Eosinophils Relative 0 0 - 5 %   Eosinophils Absolute 0.0 0.0 - 0.7 K/uL   Basophils Relative 1 0 - 1 %   Basophils Absolute 0.0 0.0 - 0.1 K/uL  I-Stat Chem 8, ED     Status: Abnormal   Collection Time: 01/06/2015  2:58 PM  Result Value Ref Range   Sodium 138 135 - 145 mmol/L   Potassium 3.2 (L) 3.5 - 5.1 mmol/L   Chloride 96 96 - 112 mmol/L   BUN 27 (H) 6 - 23 mg/dL   Creatinine, Ser 6.20 (H) 0.50 - 1.35 mg/dL   Glucose, Bld 95 70 - 99 mg/dL   Calcium, Ion 1.06 (L) 1.13 - 1.30 mmol/L   TCO2 24 0 - 100 mmol/L   Hemoglobin 13.9 13.0 - 17.0 g/dL   HCT 41.0 39.0 - 52.0 %  I-stat troponin, ED     Status: None   Collection Time: 01/02/2015  3:01 PM  Result Value Ref Range   Troponin i, poc 0.07 0.00 - 0.08 ng/mL   Comment 3            Comment: Due to the release kinetics of cTnI, a negative result within the first hours of the onset of symptoms does not rule out myocardial infarction with certainty. If myocardial infarction is still suspected, repeat the test at appropriate intervals.   I-Stat CG4 Lactic Acid, ED     Status: Abnormal   Collection Time: 01/14/2015  3:03 PM  Result Value Ref Range   Lactic Acid, Venous 5.53 (HH) 0.5 - 2.0 mmol/L   Comment NOTIFIED PHYSICIAN   I-Stat arterial blood gas, ED     Status: Abnormal   Collection Time: 01/18/2015  3:13 PM  Result Value Ref Range   pH, Arterial 7.378 7.350 - 7.450   pCO2 arterial 37.7 35.0 - 45.0 mmHg   pO2, Arterial 145.0 (H) 80.0 - 100.0 mmHg   Bicarbonate 22.2 20.0 - 24.0 mEq/L   TCO2 23 0 -  100 mmol/L   O2 Saturation 99.0 %   Acid-base deficit 3.0 (H) 0.0 - 2.0 mmol/L   Patient temperature 98.6 F    Collection site RADIAL, ALLEN'S TEST ACCEPTABLE    Drawn by Operator    Sample type ARTERIAL     Dg Chest Portable  1 View  01/05/2015   CLINICAL DATA:  Cardiac arrest  EXAM: PORTABLE CHEST - 1 VIEW  COMPARISON:  08/21/2014  FINDINGS: Cardiac shadow remains enlarged. A defibrillator is again noted and stable. A nasogastric catheter is seen within stomach. An endotracheal tube is noted approximately 4.6 cm above the carina. The lungs are clear.  IMPRESSION: Tubes and lines as described.  No acute abnormality noted.   Electronically Signed   By: Inez Catalina M.D.   On: 01/19/2015 15:43    Assessment/Plan 1 status post cardiac arrest-patient had a witnessed arrest. He did not receive CPR for approximately 8 minutes and then was resuscitated when EMS arrived. His electrocardiogram suggests inferior ST elevation. I discussed this with his wife by phone. Plan to proceed with emergent cardiac catheterization. The risks and benefits were discussed with her and she agreed. He has been seen by critical care medicine and cooling has been initiated. Cycle enzymes. Patient does have a laceration over his right eye. He will need a head CT. 2 status post ICD-device will need to be interrogated. 3. VDRF-management per critical care medicine. Patient being cooled. 4 end-stage renal disease-nephrology to follow.  Kirk Ruths MD 01/09/2015, 4:07 PM

## 2014-12-29 NOTE — ED Notes (Signed)
Critical care at the bedside. 

## 2014-12-29 NOTE — ED Notes (Signed)
Pharmacy called for levo drip.

## 2014-12-29 NOTE — Progress Notes (Signed)
Patient came in to ED intubated by EMS with a 7.0 ETT taped at 25 cm at lip, placed on above vent settings, ABG obtained.

## 2014-12-29 NOTE — H&P (Signed)
PULMONARY / CRITICAL CARE MEDICINE   Name: Ryan Campbell MRN: [id number] DOB: [birth year]    ADMISSION DATE:  01/10/2015   REFERRING MD :  edp  CHIEF COMPLAINT:  Post arest  INITIAL PRESENTATION: Post arrfest  STUDIES:    SIGNIFICANT EVENTS: Sudden arrest 4/3   HISTORY OF PRESENT ILLNESS:     74 yo with known chf, syncope, AICD implantation who was admitted to [facility] ED 4/3 post arrest after 8  Minutes of NO CPR(at [facility]) until EMS arrive and started  CPR with ROSC.  Seen by [facility] in ED with plan to interrogate his AICD and most likely go to Cardiac cth lab due to inferior EKG lead changes. PCCM will admit and place on hypothermia protocol. Note the EDP is placing cvl. PAST MEDICAL HISTORY :   has a past medical history of Erectile dysfunction; HTN (hypertension); Cardiovascular collapse (2011); ESRD on dialysis; AICD (automatic cardioverter/defibrillator) present (2011); Type II diabetes mellitus; Pneumonia (07/2014); and CHF (congestive heart failure).  has past surgical history that includes Ganglion cyst excision (Right); Cataract extraction w/ intraocular lens  implant, bilateral (Bilateral, 2007); Cardiac defibrillator placement (2011); Shunt externalization (2006); Carpal tunnel release (Left); Arteriovenous graft placement (Left); and Colonoscopy with propofol (N/A, 12/09/2014). Prior to Admission medications   Medication Sig Start Date End Date Taking? Authorizing Provider  aspirin 81 MG tablet Take 81 mg by mouth daily.    Historical Provider, MD  B Complex-C-Folic Acid (NEPHROCAPS PO) Take 1 capsule by mouth daily.    Historical Provider, MD  ceFAZolin 2 g in dextrose 5 % 50 mL ivpb Inject 2 g into the vein one time in dialysis. For 2 weeks, end date is Sunday 09/08/14 08/25/14   Verlee Monte, MD  ferrous sulfate 325 (65 FE) MG tablet Take 325 mg by mouth 2 (two) times daily.    Historical Provider, MD  insulin regular (NOVOLIN R,HUMULIN R) 100 units/mL injection  Inject 2 Units into the skin daily after supper.    Historical Provider, MD  loperamide (IMODIUM) 2 MG capsule Take 1 capsule (2 mg total) by mouth 4 (four) times daily as needed for diarrhea or loose stools. 08/16/14   Dalia Heading, PA-C  metoprolol (LOPRESSOR) 50 MG tablet Take 50 mg by mouth 2 (two) times daily.    Historical Provider, MD  Multiple Vitamins-Minerals (CENTRUM SILVER PO) Take 1 capsule by mouth daily.     Historical Provider, MD  saccharomyces boulardii (FLORASTOR) 250 MG capsule Take 1 capsule (250 mg total) by mouth 2 (two) times daily. 08/25/14   Verlee Monte, MD  sevelamer carbonate (RENVELA) 800 MG tablet Take 1,600 mg by mouth daily.     Historical Provider, MD   Allergies  Allergen Reactions  . Statins Cough    FAMILY HISTORY:  has no family status information on file.  SOCIAL HISTORY:  reports that he has never smoked. He has never used smokeless tobacco. He reports that he does not drink alcohol or use illicit drugs.  REVIEW OF SYSTEMS:  na  SUBJECTIVE:   VITAL SIGNS: Pulse Rate:  [53-131] 53 (04/03 1500) Resp:  [16-18] 16 (04/03 1500) BP: (51-124)/(35-81) 71/53 mmHg (04/03 1500) SpO2:  [92 %-97 %] 97 % (04/03 1500) FiO2 (%):  [60 %-100 %] 100 % (04/03 1500) Weight:  [270 lb (122.471 kg)] 270 lb (122.471 kg) (04/03 1446) HEMODYNAMICS:   VENTILATOR SETTINGS: Vent Mode:  [-] PRVC FiO2 (%):  [60 %-100 %] 100 % Set Rate:  [14 bmp]  14 bmp Vt Set:  [650 mL] 650 mL PEEP:  [5 cmH20] 5 cmH20 Plateau Pressure:  [25 cmH20] 25 cmH20 INTAKE / OUTPUT: No intake or output data in the 24 hours ending 01/12/2015 1533  PHYSICAL EXAMINATION: General:  WNWD AAM unresponsive Neuro:  No response to stimulus HEENT: Pupils pin point, disconjugate gaze Cardiovascular:  hsd rrr Lungs:  cta Abdomen:  Obese Musculoskeletal: intact Skin:  Left fa graft with no bruit or trill  LABS:  CBC  Recent Labs Lab 01/19/2015 1458  HGB 13.9  HCT 41.0   Coag's No  results for input(s): APTT, INR in the last 168 hours. BMET  Recent Labs Lab 12/28/2014 1458  NA 138  K 3.2*  CL 96  BUN 27*  CREATININE 6.20*  GLUCOSE 95   Electrolytes No results for input(s): CALCIUM, MG, PHOS in the last 168 hours. Sepsis Markers  Recent Labs Lab 01/17/2015 1503  LATICACIDVEN 5.53*   ABG  Recent Labs Lab 12/28/2014 1513  PHART 7.378  PCO2ART 37.7  PO2ART 145.0*   Liver Enzymes No results for input(s): AST, ALT, ALKPHOS, BILITOT, ALBUMIN in the last 168 hours. Cardiac Enzymes No results for input(s): TROPONINI, PROBNP in the last 168 hours. Glucose No results for input(s): GLUCAP in the last 168 hours.  Imaging No results found.   ASSESSMENT / PLAN:  PULMONARY OETT 4/3>> A: VDRF post arrest P:   Vent bundle  CARDIOVASCULAR CVL4/3>> A:  Post arrest Inferior lead changes Sudden death with AICD in place CHF Syncope P:  4/3 1545 hypothermia protocol Interventions per Cards  RENAL Lab Results  Component Value Date   CREATININE 6.20* 01/25/2015   CREATININE 6.93* 08/24/2014   CREATININE 8.65* 08/23/2014    A: ESRD P:   Will need renal consult  GASTROINTESTINAL A:   GI protection P:   PPI  HEMATOLOGIC A:   No acute issue P:    INFECTIOUS A:   No acute issue P:     ENDOCRINE A:   No acute issue P:     NEUROLOGIC A:   AMS post cardiac arrest P:   RASS goal: -4 with hypothermia CT head ro other causes of arrest   FAMILY  - Updates:   - Inter-disciplinary family meet or Palliative Care meeting due by:  day 7    TODAY'S SUMMARY:    74 yo with known chf, syncope, AICD implantation who was admitted to [facility] ED 4/3 post arrest after 8  Minutes of NO CPR(at [facility]) until EMS arrive and started  CPR with ROSC.  Seen by [facility] in ED with plan to interrogate his AICD and most likely go to Cardiac cth lab due to inferior EKG lead changes. PCCM will admit and place on hypothermia protocol. Note the  EDP is placing cvl.  Richardson Landry Minor ACNP Maryanna Shape PCCM Pager [phone] till 3 pm If no answer page [phone] 01/19/2015, 3:39 PM

## 2014-12-29 NOTE — ED Notes (Signed)
Placed in c-collar.

## 2014-12-29 NOTE — Progress Notes (Signed)
UR Completed.  [phone]  

## 2014-12-29 NOTE — ED Notes (Signed)
EMS started cold saline at 1359

## 2014-12-29 NOTE — Progress Notes (Signed)
BP in 50's, 1 epi given, ELink MD camera'ed in and aware. [place], Ardeth Sportsman

## 2014-12-29 NOTE — Consult Note (Signed)
[facility] Renal Consultation Note    Indication for Consultation:  Management of ESRD/hemodialysis; anemia, hypertension/volume and secondary hyperparathyroidism  HPI: Ryan Campbell is a 74 y.o. male.  Pt is a 74yo AAM with PMH sig for CAD, CMP s/p AICD, HTN, CHF, and ESRD (on HD in [place] [facility] followed by Dr. Candiss Norse) who collapsed at [facility] and was down for 8 minutes without CPR until EMS could respond.  His AICD was interrogated and revealed multiple defibrillations for recurrent V fib.  He was intubated, placed on the hypothermic protocol and went to emergent cardiac cath which revealed significant multivessel CAD.  We were asked to help manage his ESRD and provide hemodialysis while he remains an inpatient.  He is currently on pressors and had evidence of an elevated LVEDP.  Per report he did go to dialysis yesterday.  Past Medical History  Diagnosis Date  . Erectile dysfunction   . HTN (hypertension)   . Cardiovascular collapse 2011  . ESRD on dialysis     "[facility]; TTS" (08/21/2014)  . AICD (automatic cardioverter/defibrillator) present 2011    for syncope with FH of sudden death  . Type II diabetes mellitus   . Pneumonia 07/2014    Archie Endo 08/21/2014  . CHF (congestive heart failure)    Past Surgical History  Procedure Laterality Date  . Ganglion cyst excision Right   . Cataract extraction w/ intraocular lens  implant, bilateral Bilateral 2007  . Cardiac defibrillator placement  2011    [employer], implanted in [place] for syncope with FH of sudden death  . Shunt externalization  2006  . Carpal tunnel release Left   . Arteriovenous graft placement Left     arm  . Colonoscopy with propofol N/A 12/09/2014    Procedure: COLONOSCOPY WITH PROPOFOL;  Surgeon: Garlan Fair, MD;  Location: [facility] ENDOSCOPY;  Service: Endoscopy;  Laterality: N/A;   Family History:   Family History  Problem Relation Age of Onset  . Diabetes Father      deceased  . Hypertension Father     deceased  . Diabetes Mother     deceased  . Hypertension Mother     deceased  . Coronary artery disease Mother     deceased  . Coronary artery disease Maternal Grandmother   . Diabetes Maternal Grandmother    Social History:  reports that he has never smoked. He has never used smokeless tobacco. He reports that he does not drink alcohol or use illicit drugs. Allergies  Allergen Reactions  . Statins Cough   Prior to Admission medications   Medication Sig Start Date End Date Taking? Authorizing Provider  aspirin 81 MG tablet Take 81 mg by mouth daily.    Historical Provider, MD  B Complex-C-Folic Acid (NEPHROCAPS PO) Take 1 capsule by mouth daily.    Historical Provider, MD  ceFAZolin 2 g in dextrose 5 % 50 mL ivpb Inject 2 g into the vein one time in dialysis. For 2 weeks, end date is Sunday 09/08/14 08/25/14   Verlee Monte, MD  ferrous sulfate 325 (65 FE) MG tablet Take 325 mg by mouth 2 (two) times daily.    Historical Provider, MD  insulin regular (NOVOLIN R,HUMULIN R) 100 units/mL injection Inject 2 Units into the skin daily after supper.    Historical Provider, MD  loperamide (IMODIUM) 2 MG capsule Take 1 capsule (2 mg total) by mouth 4 (four) times daily as needed for diarrhea or loose stools. 08/16/14  Dalia Heading, PA-C  metoprolol (LOPRESSOR) 50 MG tablet Take 50 mg by mouth 2 (two) times daily.    Historical Provider, MD  Multiple Vitamins-Minerals (CENTRUM SILVER PO) Take 1 capsule by mouth daily.     Historical Provider, MD  saccharomyces boulardii (FLORASTOR) 250 MG capsule Take 1 capsule (250 mg total) by mouth 2 (two) times daily. 08/25/14   Verlee Monte, MD  sevelamer carbonate (RENVELA) 800 MG tablet Take 1,600 mg by mouth daily.     Historical Provider, MD   Current Facility-Administered Medications  Medication Dose Route Frequency Provider Last Rate Last Dose  . 0.9 %  sodium chloride infusion  2,000 mL Intravenous Once  Grace Bushy Minor, NP      . 0.9 %  sodium chloride infusion   Intravenous Continuous Troy Sine, MD      . acetaminophen (TYLENOL) tablet 650 mg  650 mg Oral Q4H PRN Troy Sine, MD      . Derrill Memo ON 12/30/2014] antiseptic oral rinse (CPC / CETYLPYRIDINIUM CHLORIDE 0.05%) solution 7 mL  7 mL Mouth Rinse QID Kara Mead V, MD      . artificial tears (LACRILUBE) ophthalmic ointment 1 application  1 application Both Eyes 3 times per day Grace Bushy Minor, NP      . Derrill Memo ON 12/30/2014] aspirin EC tablet 81 mg  81 mg Oral Daily Troy Sine, MD      . aspirin suppository 300 mg  300 mg Rectal NOW Grace Bushy Minor, NP      . chlorhexidine (PERIDEX) 0.12 % solution 15 mL  15 mL Mouth Rinse BID Kara Mead V, MD   15 mL at 01/16/2015 1814  . cisatracurium (NIMBEX) 200 mg in sodium chloride 0.9 % 200 mL (1 mg/mL) infusion  1-1.5 mcg/kg/min Intravenous Continuous Grace Bushy Minor, NP 11.4 mL/hr at 12/28/2014 2000 1.5 mcg/kg/min at 12/27/2014 2000   And  . cisatracurium (NIMBEX) bolus via infusion 6.4 mg  0.05 mg/kg Intravenous PRN Grace Bushy Minor, NP      . dexmedetomidine (PRECEDEX) 200 MCG/50ML (4 mcg/mL) infusion  0.4-1.2 mcg/kg/hr Intravenous Titrated Vishal Mungal, MD 25.4 mL/hr at 01/08/2015 2100 0.8 mcg/kg/hr at 01/07/2015 2100  . EPINEPHrine (ADRENALIN) 4 mg in dextrose 5 % 250 mL (0.016 mg/mL) infusion  5 mcg/min Intravenous Titrated Vishal Mungal, MD 3.8 mL/hr at 12/28/2014 2000 1 mcg/min at 01/02/2015 2000  . fentaNYL (SUBLIMAZE) 2,500 mcg in sodium chloride 0.9 % 250 mL (10 mcg/mL) infusion  25-400 mcg/hr Intravenous Continuous Grace Bushy Minor, NP 30 mL/hr at 01/24/2015 2030 300 mcg/hr at 01/21/2015 2030  . fentaNYL (SUBLIMAZE) bolus via infusion 25 mcg  25 mcg Intravenous Q30 min PRN Grace Bushy Minor, NP      . fentaNYL (SUBLIMAZE) injection 50 mcg  50 mcg Intravenous Once Grace Bushy Minor, NP      . Derrill Memo ON 12/30/2014] heparin injection 5,000 Units  5,000 Units Subcutaneous 3 times per day Grace Bushy Minor, NP       . norepinephrine (LEVOPHED) 16 mg in dextrose 5 % 250 mL (0.064 mg/mL) infusion  0-50 mcg/min Intravenous Titrated Kara Mead V, MD 9.4 mL/hr at 12/28/2014 2100 10 mcg/min at 12/30/2014 2100  . ondansetron (ZOFRAN) injection 4 mg  4 mg Intravenous Q6H PRN Troy Sine, MD      . vasopressin (PITRESSIN) 40 Units in sodium chloride 0.9 % 250 mL (0.16 Units/mL) infusion  0.03 Units/min Intravenous Continuous Vishal Mungal, MD 11.3 mL/hr at 01/21/2015 2000 0.03  Units/min at 12/27/2014 2000   Labs: Basic Metabolic Panel:  Recent Labs Lab 12/28/2014 1453 01/15/2015 1458 01/04/2015 1824 01/21/2015 1951  NA 138 138 139 139  K 3.2* 3.2* 3.6 3.3*  CL 94* 96 98 98  CO2 26  --   --   --   GLUCOSE 97 95 86 128*  BUN 24* 27* 27* 28*  CREATININE 6.54* 6.20* 5.80* 5.60*  CALCIUM 9.2  --   --   --    Liver Function Tests:  Recent Labs Lab 01/12/2015 1453  AST 34  ALT 14  ALKPHOS 83  BILITOT <0.1*  PROT 8.4*  ALBUMIN 3.4*   No results for input(s): LIPASE, AMYLASE in the last 168 hours. No results for input(s): AMMONIA in the last 168 hours. CBC:  Recent Labs Lab 12/30/2014 1453 01/02/2015 1458 01/10/2015 1824 01/25/2015 1951  WBC 6.5  --   --   --   NEUTROABS 3.8  --   --   --   HGB 12.1* 13.9 13.6 14.6  HCT 36.6* 41.0 40.0 43.0  MCV 92.0  --   --   --   PLT 221  --   --   --    Cardiac Enzymes:  Recent Labs Lab 01/21/2015 1453  TROPONINI 0.10*   CBG:  Recent Labs Lab 12/30/2014 1921 12/30/2014 1949 01/06/2015 2108  GLUCAP 113* 124* 126*   Iron Studies: No results for input(s): IRON, TIBC, TRANSFERRIN, FERRITIN in the last 72 hours. Studies/Results: Ct Head Wo Contrast  01/22/2015   CLINICAL DATA:  Traumatic head injury with lacerations. Cardiac arrest this afternoon. Intubated and unresponsive. End-stage renal disease.  EXAM: CT HEAD WITHOUT CONTRAST  CT CERVICAL SPINE WITHOUT CONTRAST  TECHNIQUE: Multidetector CT imaging of the head and cervical spine was performed following the standard  protocol without intravenous contrast. Multiplanar CT image reconstructions of the cervical spine were also generated.  COMPARISON:  03/21/2014  FINDINGS: CT HEAD FINDINGS  The folds in the cerebellum appears somewhat more effaced than on the prior CT scan but no focal cerebellar abnormality is observed. Brainstem unremarkable in CT appearance. The thalami and basal ganglia appear as expected. Ventricular system and basilar cisterns normal.  Gray-white differentiation throughout much of the cerebral cortex is reduced compared to the prior exam, although no overt sulcal effacement is seen.  No intracranial hemorrhage or mass lesion is identified.  Forehead scalp hematoma noted near the midline. Chronic left maxillary sinusitis with some focal hyperdensity centrally in the sinus. Chronic ethmoid sinusitis.  There is atherosclerotic calcification of the cavernous carotid arteries bilaterally.  CT CERVICAL SPINE FINDINGS  With third spacing of fluid with subcutaneous edema and edema tracking along tissue planes in the neck and upper mediastinum. Coronary atherosclerosis. Left pleural effusion with suspected passive atelectasis or aspiration pneumonitis. Endotracheal intubation. Scattered small lymph nodes in the left lower neck.  Incidental failure of fusion of the posterior arch of C1.  Despite efforts by the technologist and patient, motion artifact is present on today's exam and could not be eliminated. This reduces exam sensitivity and specificity. Uncinate spurring and loss of disc height at C4-5, C5-6, and C6-7. No cervical spine fracture or subluxation is identified. Suspected ossicle just above the right first costovertebral junction.  IMPRESSION: 1. Cerebral gray-white junction faintly seen but much less conspicuous than on the prior exam. This raises concern for global cerebral ischemia. No overt sulcal effacement except perhaps in the cerebellum. 2. No intracranial hemorrhage or mass lesion identified. 3.  Forehead scalp hematoma near the midline. 4. Chronic left maxillary and ethmoid sinusitis. Focal high did in density in the left maxillary sinus is of uncertain significance but may be from chronic inflammation. 5. No acute cervical spine findings. Degenerative cervical spondylosis and degenerative disc disease at several levels. 6. Third spacing of fluid with subcutaneous edema and edema tracking along the spaces of the neck. 7. Left pleural effusion with dependent airspace opacity in the left lung.   Electronically Signed   By: Van Clines M.D.   On: 01/05/2015 17:08   Ct Cervical Spine Wo Contrast  01/17/2015   CLINICAL DATA:  Traumatic head injury with lacerations. Cardiac arrest this afternoon. Intubated and unresponsive. End-stage renal disease.  EXAM: CT HEAD WITHOUT CONTRAST  CT CERVICAL SPINE WITHOUT CONTRAST  TECHNIQUE: Multidetector CT imaging of the head and cervical spine was performed following the standard protocol without intravenous contrast. Multiplanar CT image reconstructions of the cervical spine were also generated.  COMPARISON:  03/21/2014  FINDINGS: CT HEAD FINDINGS  The folds in the cerebellum appears somewhat more effaced than on the prior CT scan but no focal cerebellar abnormality is observed. Brainstem unremarkable in CT appearance. The thalami and basal ganglia appear as expected. Ventricular system and basilar cisterns normal.  Gray-white differentiation throughout much of the cerebral cortex is reduced compared to the prior exam, although no overt sulcal effacement is seen.  No intracranial hemorrhage or mass lesion is identified.  Forehead scalp hematoma noted near the midline. Chronic left maxillary sinusitis with some focal hyperdensity centrally in the sinus. Chronic ethmoid sinusitis.  There is atherosclerotic calcification of the cavernous carotid arteries bilaterally.  CT CERVICAL SPINE FINDINGS  With third spacing of fluid with subcutaneous edema and edema tracking  along tissue planes in the neck and upper mediastinum. Coronary atherosclerosis. Left pleural effusion with suspected passive atelectasis or aspiration pneumonitis. Endotracheal intubation. Scattered small lymph nodes in the left lower neck.  Incidental failure of fusion of the posterior arch of C1.  Despite efforts by the technologist and patient, motion artifact is present on today's exam and could not be eliminated. This reduces exam sensitivity and specificity. Uncinate spurring and loss of disc height at C4-5, C5-6, and C6-7. No cervical spine fracture or subluxation is identified. Suspected ossicle just above the right first costovertebral junction.  IMPRESSION: 1. Cerebral gray-white junction faintly seen but much less conspicuous than on the prior exam. This raises concern for global cerebral ischemia. No overt sulcal effacement except perhaps in the cerebellum. 2. No intracranial hemorrhage or mass lesion identified. 3. Forehead scalp hematoma near the midline. 4. Chronic left maxillary and ethmoid sinusitis. Focal high did in density in the left maxillary sinus is of uncertain significance but may be from chronic inflammation. 5. No acute cervical spine findings. Degenerative cervical spondylosis and degenerative disc disease at several levels. 6. Third spacing of fluid with subcutaneous edema and edema tracking along the spaces of the neck. 7. Left pleural effusion with dependent airspace opacity in the left lung.   Electronically Signed   By: Van Clines M.D.   On: 01/02/2015 17:08   Dg Chest Port 1 View  12/28/2014   CLINICAL DATA:  Respiratory failure, line placement  EXAM: PORTABLE CHEST - 1 VIEW  COMPARISON:  01/15/2015  FINDINGS: The external pacing pads are noted, obscuring detail. Left-sided AICD in place. Right IJ central line tip terminates over the mid SVC. Endotracheal tube is appropriately positioned. Nasogastric tube tip terminates below the level  of the hemidiaphragms but is not  included in the field of view. Moderate enlargement of the cardiac silhouette is noted with crowding of the bronchovascular markings but no focal pulmonary opacity.  IMPRESSION: Cardiomegaly without focal acute finding allowing for technique. If symptoms persist, consider PA and lateral chest radiographs obtained at full inspiration when the patient is clinically able.   Electronically Signed   By: Conchita Paris M.D.   On: 01/12/2015 16:33   Dg Chest Portable 1 View  01/05/2015   CLINICAL DATA:  Cardiac arrest  EXAM: PORTABLE CHEST - 1 VIEW  COMPARISON:  08/21/2014  FINDINGS: Cardiac shadow remains enlarged. A defibrillator is again noted and stable. A nasogastric catheter is seen within stomach. An endotracheal tube is noted approximately 4.6 cm above the carina. The lungs are clear.  IMPRESSION: Tubes and lines as described.  No acute abnormality noted.   Electronically Signed   By: Inez Catalina M.D.   On: 01/02/2015 15:43    ROS: Review of systems not obtained due to patient factors. Physical Exam: Filed Vitals:   01/21/2015 1930 01/09/2015 2000 01/03/2015 2030 01/10/2015 2100  BP: 131/98 135/87 116/88   Pulse:   25 107  Temp: 96.1 F (35.6 C) 95.4 F (35.2 C) 94.8 F (34.9 C) 93.7 F (34.3 C)  TempSrc: Core (Comment) Core (Comment) Core (Comment) Core (Comment)  Resp: 16 15 16 15   Height:      Weight:      SpO2:   98% 99%      Weight change:   Intake/Output Summary (Last 24 hours) at 01/09/2015 2111 Last data filed at 12/30/2014 2100  Gross per 24 hour  Intake  196.3 ml  Output      0 ml  Net  196.3 ml   BP 116/88 mmHg  Pulse 107  Temp(Src) 93.7 F (34.3 C) (Core (Comment))  Resp 15  Ht 6\' 4"  (1.93 m)  Wt 127.007 kg (280 lb)  BMI 34.10 kg/m2  SpO2 99% General appearance: Intubated and sedated Head: scalp contusion, laceration over right eye Eyes: left pupil pinpoint, right pupil 66mm Resp: occassional rhonchi bilaterally Cardio: irregular, no rub GI: obese/distended, soft,  +BS Extremities: edema 1+ on left leg, trace on right and L AVG +T/B Dialysis Access:  Dialysis Orders: Center: [facility]  on TTS .  No additional HD information available at this time  Assessment/Plan: 1.  V fib arrest/cardiogenic shock- hypothermic protocol, and continues to require pressors and also with ongoing Vfib/tach.  plan per cardiology 2. Multivessel CAD- per cardiology he is not a candidate for any further interventions at his current condition. 3. CMP s/p AICD with ongoing arrythmias 4. VDRF- per PCCM 5.  ESRD -  Per PCCM wanted to consider CVVHD given his cardiogenic shock and elevated CVP.  Will wait for HD cath and initiate CVVHD if family wants to continue with aggessive measures, although patient is currently going in and out of V fib/tach 6.  Hypertension/volume  - elevated CVP, on 3 pressors with normal BP 7.  Anemia  - stable 8.  Metabolic bone disease -  Follow Ca/Phos 9.  Nutrition - per PCCM 10. Disposition- patient with poor overall prognosis as he is not a candidate for any cardiac intervention.  Recommend family discussion and consideration for comfort measures only.  Raylyn Carton A 01/08/2015, 9:11 PM

## 2014-12-29 NOTE — ED Provider Notes (Signed)
CSN: [id number]     Arrival date & time 01/12/2015  1438 History   First MD Initiated Contact with Patient 01/20/2015 1454     Chief Complaint  Patient presents with  . Cardiac Arrest     (Consider location/radiation/quality/duration/timing/severity/associated sxs/prior Treatment) HPI Comments: Patient was a witnessed arrest at the [employer]. Patient was speaking with LAD at the counter and collapsed onto the floor. She denied any complaint that he had shortness of breath, chest pain or any other symptoms. Patient laid on the ground for 8 minutes until EMS arrived. No bystander CPR was started. When EMS arrived patient was asystolic on the monitor. An IO was placed and patient was given 3 rounds of epinephrine with compressions and return of spontaneous circulation. Patient was intubated at the scene with a 7.0 tube. He was biting at the tube and was given 5 mg of Versed in route.  Speaking with patient's wife she is currently out of town but he had told her her blood his blood pressure had been running a little bit low. He last had dialysis yesterday  The history is provided by the EMS personnel. The history is limited by the condition of the patient.    Past Medical History  Diagnosis Date  . Erectile dysfunction   . HTN (hypertension)   . Cardiovascular collapse 2011  . ESRD on dialysis     "[place]; TTS" (08/21/2014)  . AICD (automatic cardioverter/defibrillator) present 2011    for syncope with FH of sudden death  . Type II diabetes mellitus   . Pneumonia 07/2014    Archie Endo 08/21/2014  . CHF (congestive heart failure)    Past Surgical History  Procedure Laterality Date  . Ganglion cyst excision Right   . Cataract extraction w/ intraocular lens  implant, bilateral Bilateral 2007  . Cardiac defibrillator placement  2011    [employer], implanted in [place] for syncope with FH of sudden death  . Shunt externalization  2006  . Carpal tunnel release Left    . Arteriovenous graft placement Left     arm  . Colonoscopy with propofol N/A 12/09/2014    Procedure: COLONOSCOPY WITH PROPOFOL;  Surgeon: Garlan Fair, MD;  Location: [facility];  Service: Endoscopy;  Laterality: N/A;   Family History  Problem Relation Age of Onset  . Diabetes Father     deceased  . Hypertension Father     deceased  . Diabetes Mother     deceased  . Hypertension Mother     deceased  . Coronary artery disease Mother     deceased  . Coronary artery disease Maternal Grandmother   . Diabetes Maternal Grandmother    History  Substance Use Topics  . Smoking status: Never Smoker   . Smokeless tobacco: Never Used  . Alcohol Use: No    Review of Systems  Unable to perform ROS     Allergies  Statins  Home Medications   Prior to Admission medications   Medication Sig Start Date End Date Taking? Authorizing Provider  aspirin 81 MG tablet Take 81 mg by mouth daily.    Historical Provider, MD  B Complex-C-Folic Acid (NEPHROCAPS PO) Take 1 capsule by mouth daily.    Historical Provider, MD  ceFAZolin 2 g in dextrose 5 % 50 mL ivpb Inject 2 g into the vein one time in dialysis. For 2 weeks, end date is Sunday 09/08/14 08/25/14   Verlee Monte, MD  ferrous sulfate 325 (  65 FE) MG tablet Take 325 mg by mouth 2 (two) times daily.    Historical Provider, MD  insulin regular (NOVOLIN R,HUMULIN R) 100 units/mL injection Inject 2 Units into the skin daily after supper.    Historical Provider, MD  loperamide (IMODIUM) 2 MG capsule Take 1 capsule (2 mg total) by mouth 4 (four) times daily as needed for diarrhea or loose stools. 08/16/14   Dalia Heading, PA-C  metoprolol (LOPRESSOR) 50 MG tablet Take 50 mg by mouth 2 (two) times daily.    Historical Provider, MD  Multiple Vitamins-Minerals (CENTRUM SILVER PO) Take 1 capsule by mouth daily.     Historical Provider, MD  saccharomyces boulardii (FLORASTOR) 250 MG capsule Take 1 capsule (250 mg total) by mouth 2 (two)  times daily. 08/25/14   Verlee Monte, MD  sevelamer carbonate (RENVELA) 800 MG tablet Take 1,600 mg by mouth daily.     Historical Provider, MD   BP 78/55 mmHg  Pulse 80  Resp 15  Ht 6\' 4"  (1.93 m)  Wt 280 lb (127.007 kg)  BMI 34.10 kg/m2  SpO2 96% Physical Exam  Constitutional: He appears well-developed and well-nourished. No distress.  Intubated and unresponsive  HENT:  Head: Normocephalic.  Mouth/Throat: Oropharynx is clear and moist.  Small laceration to teh right right eyebrow and center of forehead  Eyes: Conjunctivae and EOM are normal.  pinpoint  Neck: Normal range of motion. Neck supple.  Cardiovascular: Normal rate, regular rhythm and intact distal pulses.   No murmur heard. Pulmonary/Chest: Effort normal and breath sounds normal. No respiratory distress. He has no wheezes. He has no rales.  Abdominal: Soft. He exhibits no distension. There is no tenderness. There is no rebound and no guarding.  Musculoskeletal: Normal range of motion. He exhibits no edema or tenderness.  Dialysis fistula in LUE with out palpable thrill  Neurological:  Sucking on the tube and minimal foot movement.  Does not open eyes or respond to verbal stimuli  Skin: Skin is warm and dry. No rash noted. No erythema.  Healing wound to the right lower ext.  Nursing note and vitals reviewed.   ED Course  Procedures (including critical care time) Labs Review Labs Reviewed  CBC WITH DIFFERENTIAL/PLATELET - Abnormal; Notable for the following:    RBC 3.98 (*)    Hemoglobin 12.1 (*)    HCT 36.6 (*)    All other components within normal limits  I-STAT CHEM 8, ED - Abnormal; Notable for the following:    Potassium 3.2 (*)    BUN 27 (*)    Creatinine, Ser 6.20 (*)    Calcium, Ion 1.06 (*)    All other components within normal limits  I-STAT CG4 LACTIC ACID, ED - Abnormal; Notable for the following:    Lactic Acid, Venous 5.53 (*)    All other components within normal limits  I-STAT ARTERIAL BLOOD  GAS, ED - Abnormal; Notable for the following:    pO2, Arterial 145.0 (*)    Acid-base deficit 3.0 (*)    All other components within normal limits  COMPREHENSIVE METABOLIC PANEL  TROPONIN I  BRAIN NATRIURETIC PEPTIDE  PROTIME-INR  PROTIME-INR  APTT  APTT  BLOOD GAS, ARTERIAL  I-STAT CHEM 8, ED  I-STAT TROPOININ, ED  I-STAT CG4 LACTIC ACID, ED    Imaging Review Dg Chest Portable 1 View  01/04/2015   CLINICAL DATA:  Cardiac arrest  EXAM: PORTABLE CHEST - 1 VIEW  COMPARISON:  08/21/2014  FINDINGS: Cardiac shadow remains  enlarged. A defibrillator is again noted and stable. A nasogastric catheter is seen within stomach. An endotracheal tube is noted approximately 4.6 cm above the carina. The lungs are clear.  IMPRESSION: Tubes and lines as described.  No acute abnormality noted.   Electronically Signed   By: Inez Catalina M.D.   On: 01/22/2015 15:43     EKG Interpretation   Date/Time:  Sunday December 29 2014 14:51:04 EDT Ventricular Rate:  108 PR Interval:    QRS Duration: 176 QT Interval:  455 QTC Calculation: 610 R Axis:   -92 Text Interpretation:  Atrial fibrillation Paired ventricular premature  complexes ATRIAL PACED RHYTHM Nonspecific IVCD with LAD Confirmed by  Maryan Rued  MD, Loree Fee ([id number]) on 01/11/2015 2:57:04 PM      LACERATION REPAIR Performed by: Blanchie Dessert Authorized by: Blanchie Dessert Consent: Verbal consent obtained. Risks and benefits: risks, benefits and alternatives were discussed Consent given by: patient Patient identity confirmed: provided demographic data Prepped and Draped in normal sterile fashion Wound explored  Laceration Location: forehead and above right eyebrow  Laceration Length: 1cm  No Foreign Bodies seen or palpated  Anesthesia: none Irrigation method: syringe Amount of cleaning: standard  Skin closure: dermabond  Number of sutures: dermabond  Technique: dermabond  Patient tolerance: Patient tolerated the procedure well  with no immediate complications.   MDM   Final diagnoses:  Traumatic head injury with multiple lacerations  Cardiac arrest  Hypotension, unspecified hypotension type   patient with a history of end-stage renal disease on dialysis, cardiomegaly, AICD placement and pacemaker who presents today after a witnessed arrest at the grocery store. Initial rhythm was asystole and after 3 rounds of epinephrine patient had spontaneous return of circulation. Cold saline was started immediately.  In route patient maintained a heart rate in the 80s and blood pressures in the 30Z and 60F systolic. On arrival here patient's blood pressure remained stable with a palpable pulse. He does show some signs of life with biting the tube and slight foot movement. He is not responding to commands. Patient does have mild evidence of trauma to his head and forehead from falling.  Wounds were with Dermabond.  After IV fluids patient consistently stayed hypotensive and a central line was placed with levophed starting.  EKG showing a paced rhythm however rhythm strip concerning for possible ST elevation. Dr. Stanford Breed notified and came to see the patient. Decision was made to take the patient to the Cath Lab. Critical care consult to see the patient and patient continued on cooling protocol.   CRITICAL CARE Performed by: Blanchie Dessert Total critical care time: 45 Critical care time was exclusive of separately billable procedures and treating other patients. Critical care was necessary to treat or prevent imminent or life-threatening deterioration. Critical care was time spent personally by me on the following activities: development of treatment plan with patient and/or surrogate as well as nursing, discussions with consultants, evaluation of patient's response to treatment, examination of patient, obtaining history from patient or surrogate, ordering and performing treatments and interventions, ordering and review of  laboratory studies, ordering and review of radiographic studies, pulse oximetry and re-evaluation of patient's condition.  CENTRAL LINE Performed by: Blanchie Dessert Consent: The procedure was performed in an emergent situation. Required items: required blood products, implants, devices, and special equipment available Patient identity confirmed: arm band and provided demographic data Time out: Immediately prior to procedure a "time out" was called to verify the correct patient, procedure, equipment, support staff and site/side marked as required. Indications: vascular  access Anesthesia: local infiltration Local anesthetic: lidocaine 1% with epinephrine Anesthetic total: 3 ml Patient sedated: no Preparation: skin prepped with 2% chlorhexidine Skin prep agent dried: skin prep agent completely dried prior to procedure Sterile barriers: all five maximum sterile barriers used - cap, mask, sterile gown, sterile gloves, and large sterile sheet Hand hygiene: hand hygiene performed prior to central venous catheter insertion  Location details: right IJ  Catheter type: triple lumen Catheter size: 8 Fr Pre-procedure: landmarks identified Ultrasound guidance: yes Successful placement: yes Post-procedure: line sutured and dressing applied Assessment: blood return through all parts, free fluid flow, placement verified by x-ray and no pneumothorax on x-ray Patient tolerance: Patient tolerated the procedure well with no immediate complications.     Blanchie Dessert, MD 01/05/2015 [phone]

## 2014-12-29 NOTE — ED Notes (Signed)
Pt to department via EMS- pt was at [facility] and collapsed. Pt was pulseless and apneic on ems arrival. Cpr done for about 20 minutes, 7.0 ETT in place with right tib IO. 3 epis given. Pt regained pulses and given 5mg  versed. Bp-171/138 Hr-80s cap-37.

## 2014-12-29 NOTE — ED Notes (Signed)
crtical care doctor here going to c-t

## 2014-12-29 NOTE — ED Notes (Signed)
Central line by edp.  Called for portable chest  xray

## 2014-12-29 NOTE — ED Notes (Signed)
I stat lactic acid an I stat chem 8 given to Dr. Maryan Rued by B. Yolanda Bonine, EMT

## 2014-12-29 NOTE — Procedures (Signed)
Arterial Catheter Insertion Procedure Note [facility] [id number] 06-Jan-1941  Procedure: Insertion of Arterial Catheter  Indications: Blood pressure monitoring and Frequent blood sampling  Procedure Details Consent: Unable to obtain consent because of patient being on ventilator. Time Out: Verified patient identification, verified procedure, site/side was marked, verified correct patient position, special equipment/implants available, medications/allergies/relevent history reviewed, required imaging and test results available.  Performed  Maximum sterile technique was used including antiseptics, cap, gloves, gown, hand hygiene, mask and sheet. Skin prep: Chlorhexidine; local anesthetic administered 20 gauge catheter was inserted into right radial artery using the Seldinger technique.  Evaluation Blood flow good; BP tracing good. Complications: No apparent complications.   Philomena Doheny 01/20/2015

## 2014-12-29 NOTE — CV Procedure (Signed)
Ryan Campbell is a 74 y.o. male    [id number]  [id number] LOCATION:  FACILITY: [facility]  PHYSICIAN: Troy Sine, MD, [facility] 1941/03/06   DATE OF PROCEDURE:  01/10/2015    EMERGENT CARDIAC CATHETERIZATION     HISTORY:    Ryan Campbell is a 74 y.o. male with end-stage renal disease.  He is status post ICD implantation in 2011.  Today while at [facility], he developed a witnessed cardiac arrest.  Apparently, his defibrillator was subsequently interrogated and he had under gone multiple shocks by Ryan defibrillator for recurrent ventricular fibrillation.  He did not receive CPR for 8 minutes by report.  He simply was intubated.  He was brought to [facility] where hypothermic protocol was initiated.  His ECG showed new 1 mm inferior ST elevation.  He is referred for emergent cardiac catheterization.   PROCEDURE: Left heart catheterization: coronary angiography, left ventriculography   Ryan patient was brought to [facility] cardiac catherization laboratory intubated and sedated with ice packs under his armpits state. His right groin was prepped and shaved in usual sterile fashion. Xylocaine 1% was used for local anesthesia. A 5 French sheath was inserted into Ryan R femoral artery. Diagnostic catheterizatiion was done with 5 [place] FL4, FR4, and pigtail catheters. Left ventriculography was done with 30 cc Omnipaque contrast. Hemostasis was obtained by direct manual compression. Ryan patient tolerated Ryan procedure well.   HEMODYNAMICS:   Central Aorta: 130/66   Left Ventricle: 130/31  ANGIOGRAPHY:  Left main: Large vessel which bifurcated into Ryan LAD and left circumflex vessel.   LAD: Ryan LAD was calcified.  There was 30% proximal stenosis before Ryan first septal perforating artery.  There was 20% narrowing after Ryan septal perforating artery before Ryan first diagonal.  There was diffuse 90% ostial stenosis of Ryan small diagonal vessel.  Ryan mid LAD had 80% stenosis.  Ryan  apical LAD appeared to be occluded.  There was collateralization distally around Ryan apex.  There also was collateralization to Ryan distal RCA via septal perforating arteries and collateralization to a ramus intermediate/high marginal vessel arising from Ryan very proximal circumflex  Left circumflex: Moderate size vessel that gave rise to a very high marginal vessel that had a ramus intermediate-like distribution.  This ramus-like vessel was occluded proximally and there was collateralization of Ryan distal vessel.  Ryan second proximal marginal branch was totally occluded at its origen with a moderate region of avascularity as a result of this occluded branch.  This appeared old. There are faint collaterals to Ryan distal aspect of this marginal. Ryan AV groove circumflex after this occluded marginal had diffuse 80% stenosis prior to giving rise to third marginal branch.   Right coronary artery: Moderate size vessel that had eccentric mid 80-90% stenosis diffusely.  After Ryan PDA distally, there was 80% stenosis followed by total occlusion of Ryan distal continuation branch.  There are faint left-to-right collaterals supplying a small PLA-like vessel.  Left ventriculography revealed a dilated left ventricle with diffuse global hypocontractility and an estimated ejection fraction of a approximately 30%.    IMPRESSION:  Out of Campbell VF cardiac arrest with appropriate defibrillator shocks.  Ischemic cardiomyopathy with diffuse global hypocontractility and an ejection fraction of approximately 30%.  Severe multivessel CAD with evidence for coronary calcification and 30% proximal LAD stenosis, 90% ostial diagonal stenosis, 80% mid LAD stenosis with total occlusion of Ryan apical LAD with filling beyond Ryan apex.;  Total occlusion of Ryan high obtuse marginal  one branch/ramus intermediate-like vessel with collaterals from Ryan LAD supplying Ryan distal vessel.  Proximally, total occlusion of Ryan second marginal  branch at Ryan ostium and 80% stenosis in Ryan AV groove circumflex before a mid third marginal branch; 89% mid RCA stenosis with total occlusion of Ryan continuation branch of Ryan RCA after Ryan PDA with left-to-right collaterals to Ryan PLA vessel.  RECOMMENDATION:  Ryan patient was started on Ryan hypothermic protocol.  Follow-up following stabilization and assessment of neurologic recovery, patient may require multiple vessel stenting of his mid RCA, AV groove circumflex, and mid LAD versus consideration for CABG revascularization surgery.     Troy Sine, MD, [facility] 01/15/2015 5:53 PM

## 2014-12-29 NOTE — Progress Notes (Signed)
RT Note: Pt transported from cath lab to [id number] with no complications. RT will continue to monitor.

## 2014-12-29 NOTE — Progress Notes (Addendum)
Chaplain responded to secretary calling for support post-CPR..I called family members to find daughters & wife.  Daughters came to hospital, talked with MD.  Bonney Roussel helped family move to [facility] waiting room while pt. Undergoes CT and heart cath.  Chaplain gave 2 bags of belongings to Yvetta Coder, daughter, per wife's directive.    Andee Poles can be reached at [phone].  Will follow  Rev. [place], [place]

## 2014-12-29 NOTE — ED Notes (Signed)
EDP at bedside to place central line 

## 2014-12-29 NOTE — Progress Notes (Signed)
[facility] contacted to interrogated patient's device.  Hilbert Corrigan PA Pager: [phone]

## 2014-12-30 ENCOUNTER — Other Ambulatory Visit: Payer: Self-pay

## 2014-12-30 ENCOUNTER — Inpatient Hospital Stay (HOSPITAL_COMMUNITY): Payer: Medicare Other

## 2014-12-30 DIAGNOSIS — I429 Cardiomyopathy, unspecified: Secondary | ICD-10-CM

## 2014-12-30 DIAGNOSIS — I2510 Atherosclerotic heart disease of native coronary artery without angina pectoris: Secondary | ICD-10-CM | POA: Diagnosis present

## 2014-12-30 DIAGNOSIS — Z95810 Presence of automatic (implantable) cardiac defibrillator: Secondary | ICD-10-CM

## 2014-12-30 DIAGNOSIS — G931 Anoxic brain damage, not elsewhere classified: Secondary | ICD-10-CM | POA: Diagnosis present

## 2014-12-30 DIAGNOSIS — I255 Ischemic cardiomyopathy: Secondary | ICD-10-CM

## 2014-12-30 DIAGNOSIS — I5022 Chronic systolic (congestive) heart failure: Secondary | ICD-10-CM

## 2014-12-30 DIAGNOSIS — I10 Essential (primary) hypertension: Secondary | ICD-10-CM

## 2014-12-30 DIAGNOSIS — I469 Cardiac arrest, cause unspecified: Secondary | ICD-10-CM

## 2014-12-30 DIAGNOSIS — I214 Non-ST elevation (NSTEMI) myocardial infarction: Secondary | ICD-10-CM

## 2014-12-30 LAB — RENAL FUNCTION PANEL
ALBUMIN: 3.1 g/dL — AB (ref 3.5–5.2)
ALBUMIN: 3.2 g/dL — AB (ref 3.5–5.2)
ANION GAP: 15 (ref 5–15)
Anion gap: 22 — ABNORMAL HIGH (ref 5–15)
BUN: 28 mg/dL — AB (ref 6–23)
BUN: 24 mg/dL — ABNORMAL HIGH (ref 6–23)
CALCIUM: 8.3 mg/dL — AB (ref 8.4–10.5)
CHLORIDE: 100 mmol/L (ref 96–112)
CO2: 22 mmol/L (ref 19–32)
CO2: 16 mmol/L — ABNORMAL LOW (ref 19–32)
CREATININE: 5.79 mg/dL — AB (ref 0.50–1.35)
Calcium: 8.4 mg/dL (ref 8.4–10.5)
Chloride: 98 mmol/L (ref 96–112)
Creatinine, Ser: 4.94 mg/dL — ABNORMAL HIGH (ref 0.50–1.35)
GFR calc Af Amer: 10 mL/min — ABNORMAL LOW (ref >90)
GFR calc Af Amer: 12 mL/min — ABNORMAL LOW (ref >90)
GFR calc non Af Amer: 9 mL/min — ABNORMAL LOW (ref >90)
GFR, EST NON AFRICAN AMERICAN: 11 mL/min — AB (ref >90)
Glucose, Bld: 116 mg/dL — ABNORMAL HIGH (ref 70–99)
Glucose, Bld: 88 mg/dL (ref 70–99)
PHOSPHORUS: 7 mg/dL — AB (ref 2.3–4.6)
Phosphorus: 6 mg/dL — ABNORMAL HIGH (ref 2.3–4.6)
Potassium: 4.3 mmol/L (ref 3.5–5.1)
Potassium: 5 mmol/L (ref 3.5–5.1)
Sodium: 137 mmol/L (ref 135–145)
Sodium: 136 mmol/L (ref 135–145)

## 2014-12-30 LAB — HEPARIN LEVEL (UNFRACTIONATED): Heparin Unfractionated: 0.26 IU/mL — ABNORMAL LOW (ref 0.30–0.70)

## 2014-12-30 LAB — BASIC METABOLIC PANEL
ANION GAP: 19 — AB (ref 5–15)
Anion gap: 14 (ref 5–15)
Anion gap: 17 — ABNORMAL HIGH (ref 5–15)
Anion gap: 17 — ABNORMAL HIGH (ref 5–15)
BUN: 28 mg/dL — ABNORMAL HIGH (ref 6–23)
BUN: 27 mg/dL — AB (ref 6–23)
BUN: 25 mg/dL — ABNORMAL HIGH (ref 6–23)
BUN: 23 mg/dL (ref 6–23)
CALCIUM: 7.7 mg/dL — AB (ref 8.4–10.5)
CHLORIDE: 100 mmol/L (ref 96–112)
CHLORIDE: 100 mmol/L (ref 96–112)
CHLORIDE: 100 mmol/L (ref 96–112)
CHLORIDE: 97 mmol/L (ref 96–112)
CO2: 23 mmol/L (ref 19–32)
CO2: 19 mmol/L (ref 19–32)
CO2: 18 mmol/L — AB (ref 19–32)
CO2: 16 mmol/L — ABNORMAL LOW (ref 19–32)
Calcium: 8.4 mg/dL (ref 8.4–10.5)
Calcium: 8.2 mg/dL — ABNORMAL LOW (ref 8.4–10.5)
Calcium: 8.3 mg/dL — ABNORMAL LOW (ref 8.4–10.5)
Creatinine, Ser: 5.85 mg/dL — ABNORMAL HIGH (ref 0.50–1.35)
Creatinine, Ser: 5.5 mg/dL — ABNORMAL HIGH (ref 0.50–1.35)
Creatinine, Ser: 5.13 mg/dL — ABNORMAL HIGH (ref 0.50–1.35)
Creatinine, Ser: 4.81 mg/dL — ABNORMAL HIGH (ref 0.50–1.35)
GFR calc Af Amer: 11 mL/min — ABNORMAL LOW (ref >90)
GFR calc Af Amer: 12 mL/min — ABNORMAL LOW (ref >90)
GFR calc Af Amer: 13 mL/min — ABNORMAL LOW (ref >90)
GFR calc non Af Amer: 9 mL/min — ABNORMAL LOW (ref >90)
GFR calc non Af Amer: 9 mL/min — ABNORMAL LOW (ref >90)
GFR calc non Af Amer: 10 mL/min — ABNORMAL LOW (ref >90)
GFR, EST AFRICAN AMERICAN: 10 mL/min — AB (ref >90)
GFR, EST NON AFRICAN AMERICAN: 11 mL/min — AB (ref >90)
GLUCOSE: 68 mg/dL — AB (ref 70–99)
GLUCOSE: 116 mg/dL — AB (ref 70–99)
Glucose, Bld: 117 mg/dL — ABNORMAL HIGH (ref 70–99)
Glucose, Bld: 98 mg/dL (ref 70–99)
POTASSIUM: 5.2 mmol/L — AB (ref 3.5–5.1)
POTASSIUM: 3.7 mmol/L (ref 3.5–5.1)
Potassium: 4.4 mmol/L (ref 3.5–5.1)
Potassium: 4.4 mmol/L (ref 3.5–5.1)
SODIUM: 135 mmol/L (ref 135–145)
Sodium: 137 mmol/L (ref 135–145)
Sodium: 136 mmol/L (ref 135–145)
Sodium: 132 mmol/L — ABNORMAL LOW (ref 135–145)

## 2014-12-30 LAB — POCT I-STAT, CHEM 8
BUN: 30 mg/dL — ABNORMAL HIGH (ref 6–23)
CALCIUM ION: 1.06 mmol/L — AB (ref 1.13–1.30)
CREATININE: 5.9 mg/dL — AB (ref 0.50–1.35)
Chloride: 98 mmol/L (ref 96–112)
GLUCOSE: 137 mg/dL — AB (ref 70–99)
HCT: 44 % (ref 39.0–52.0)
Hemoglobin: 15 g/dL (ref 13.0–17.0)
Potassium: 3.8 mmol/L (ref 3.5–5.1)
Sodium: 138 mmol/L (ref 135–145)
TCO2: 20 mmol/L (ref 0–100)

## 2014-12-30 LAB — POCT I-STAT 3, ART BLOOD GAS (G3+)
ACID-BASE DEFICIT: 14 mmol/L — AB (ref 0.0–2.0)
Acid-base deficit: 8 mmol/L — ABNORMAL HIGH (ref 0.0–2.0)
BICARBONATE: 15.4 meq/L — AB (ref 20.0–24.0)
Bicarbonate: 19.6 mEq/L — ABNORMAL LOW (ref 20.0–24.0)
O2 Saturation: 96 %
O2 Saturation: 92 %
PH ART: 7.163 — AB (ref 7.350–7.450)
Patient temperature: 32.2
TCO2: 21 mmol/L (ref 0–100)
TCO2: 17 mmol/L (ref 0–100)
pCO2 arterial: 37.3 mmHg (ref 35.0–45.0)
pCO2 arterial: 40.5 mmHg (ref 35.0–45.0)
pH, Arterial: 7.303 — ABNORMAL LOW (ref 7.350–7.450)
pO2, Arterial: 72 mmHg — ABNORMAL LOW (ref 80.0–100.0)
pO2, Arterial: 65 mmHg — ABNORMAL LOW (ref 80.0–100.0)

## 2014-12-30 LAB — CG4 I-STAT (LACTIC ACID)
LACTIC ACID, VENOUS: 8.9 mmol/L — AB (ref 0.5–2.0)
Lactic Acid, Venous: 8.94 mmol/L (ref 0.5–2.0)

## 2014-12-30 LAB — CBC
HEMATOCRIT: 38.7 % — AB (ref 39.0–52.0)
Hemoglobin: 12.9 g/dL — ABNORMAL LOW (ref 13.0–17.0)
MCH: 30.6 pg (ref 26.0–34.0)
MCHC: 33.3 g/dL (ref 30.0–36.0)
MCV: 91.9 fL (ref 78.0–100.0)
Platelets: 221 10*3/uL (ref 150–400)
RBC: 4.21 MIL/uL — AB (ref 4.22–5.81)
RDW: 15.4 % (ref 11.5–15.5)
WBC: 8.5 10*3/uL (ref 4.0–10.5)

## 2014-12-30 LAB — GLUCOSE, CAPILLARY
GLUCOSE-CAPILLARY: 99 mg/dL (ref 70–99)
GLUCOSE-CAPILLARY: 99 mg/dL (ref 70–99)
GLUCOSE-CAPILLARY: 185 mg/dL — AB (ref 70–99)
GLUCOSE-CAPILLARY: 79 mg/dL (ref 70–99)
Glucose-Capillary: 100 mg/dL — ABNORMAL HIGH (ref 70–99)
Glucose-Capillary: 109 mg/dL — ABNORMAL HIGH (ref 70–99)
Glucose-Capillary: 123 mg/dL — ABNORMAL HIGH (ref 70–99)
Glucose-Capillary: 125 mg/dL — ABNORMAL HIGH (ref 70–99)
Glucose-Capillary: 77 mg/dL (ref 70–99)
Glucose-Capillary: 114 mg/dL — ABNORMAL HIGH (ref 70–99)
Glucose-Capillary: 63 mg/dL — ABNORMAL LOW (ref 70–99)
Glucose-Capillary: 117 mg/dL — ABNORMAL HIGH (ref 70–99)

## 2014-12-30 LAB — APTT: APTT: 46 s — AB (ref 24–37)

## 2014-12-30 LAB — TROPONIN I: Troponin I: 4.01 ng/mL (ref <0.031)

## 2014-12-30 LAB — PROTIME-INR
INR: 1.86 — ABNORMAL HIGH (ref 0.00–1.49)
PROTHROMBIN TIME: 21.6 s — AB (ref 11.6–15.2)

## 2014-12-30 LAB — MAGNESIUM: Magnesium: 2 mg/dL (ref 1.5–2.5)

## 2014-12-30 MED ORDER — PANTOPRAZOLE SODIUM 40 MG IV SOLR
40.0000 mg | INTRAVENOUS | Status: DC
  Administered 2014-12-30 – 2014-12-31 (×2): 40 mg via INTRAVENOUS
  Filled 2014-12-30 (×3): qty 40

## 2014-12-30 MED ORDER — SODIUM CHLORIDE 0.9 % IV SOLN
1.0000 mg/h | INTRAVENOUS | Status: DC
  Filled 2014-12-30: qty 10

## 2014-12-30 MED ORDER — HEPARIN (PORCINE) IN NACL 100-0.45 UNIT/ML-% IJ SOLN
1850.0000 [IU]/h | INTRAMUSCULAR | Status: DC
  Administered 2014-12-30: 1500 [IU]/h via INTRAVENOUS
  Administered 2014-12-31 (×2): 1700 [IU]/h via INTRAVENOUS
  Administered 2015-01-01: 1850 [IU]/h via INTRAVENOUS
  Filled 2014-12-30 (×9): qty 250

## 2014-12-30 MED ORDER — ATROPINE SULFATE 0.1 MG/ML IJ SOLN
INTRAMUSCULAR | Status: AC
  Filled 2014-12-30: qty 10

## 2014-12-30 MED ORDER — INSULIN ASPART 100 UNIT/ML ~~LOC~~ SOLN
0.0000 [IU] | Freq: Three times a day (TID) | SUBCUTANEOUS | Status: DC
  Administered 2014-12-31: 2 [IU] via SUBCUTANEOUS
  Administered 2014-12-31 (×2): 3 [IU] via SUBCUTANEOUS

## 2014-12-30 MED ORDER — SODIUM BICARBONATE 8.4 % IV SOLN
50.0000 meq | Freq: Once | INTRAVENOUS | Status: AC
  Filled 2014-12-30: qty 50

## 2014-12-30 MED ORDER — SODIUM BICARBONATE 8.4 % IV SOLN
INTRAVENOUS | Status: AC
Start: 2014-12-30 — End: 2014-12-30
  Administered 2014-12-30: 50 meq
  Filled 2014-12-30: qty 50

## 2014-12-30 MED ORDER — MIDAZOLAM HCL 5 MG/ML IJ SOLN
2.0000 mg/h | INTRAVENOUS | Status: DC
  Administered 2014-12-30: 3 mg/h via INTRAVENOUS
  Administered 2014-12-31 (×2): 7 mg/h via INTRAVENOUS
  Administered 2014-12-31: 5 mg/h via INTRAVENOUS
  Administered 2014-12-31: 1 mg/h via INTRAVENOUS
  Administered 2015-01-01 (×2): 7 mg/h via INTRAVENOUS
  Filled 2014-12-30 (×6): qty 10

## 2014-12-30 MED ORDER — DEXTROSE 10 % IV SOLN
INTRAVENOUS | Status: DC
  Administered 2014-12-30: 12:00:00 via INTRAVENOUS

## 2014-12-30 MED ORDER — SODIUM BICARBONATE 8.4 % IV SOLN
INTRAVENOUS | Status: DC
  Administered 2014-12-30 – 2015-01-01 (×5): via INTRAVENOUS
  Filled 2014-12-30 (×10): qty 150

## 2014-12-30 MED ORDER — AMIODARONE HCL IN DEXTROSE 360-4.14 MG/200ML-% IV SOLN
30.0000 mg/h | INTRAVENOUS | Status: DC
  Administered 2014-12-30 – 2015-01-01 (×4): 30 mg/h via INTRAVENOUS
  Filled 2014-12-30 (×11): qty 200

## 2014-12-30 MED ORDER — DEXTROSE 50 % IV SOLN
1.0000 | Freq: Once | INTRAVENOUS | Status: AC
  Administered 2014-12-30: 50 mL via INTRAVENOUS
  Filled 2014-12-30: qty 50

## 2014-12-30 MED ORDER — DEXTROSE 50 % IV SOLN
INTRAVENOUS | Status: AC
  Filled 2014-12-30: qty 50

## 2014-12-30 MED ORDER — SODIUM BICARBONATE 8.4 % IV SOLN
INTRAVENOUS | Status: AC
  Administered 2014-12-30: 50 meq
  Filled 2014-12-30: qty 50

## 2014-12-30 MED ORDER — SODIUM BICARBONATE 8.4 % IV SOLN
100.0000 meq | Freq: Once | INTRAVENOUS | Status: AC
  Administered 2014-12-30: 100 meq via INTRAVENOUS
  Filled 2014-12-30: qty 100

## 2014-12-30 NOTE — Progress Notes (Signed)
eLink Physician-Brief Progress Note Patient Name: Ryan [facility] DOB: 15-Sep-1941 MRN: [id number]   Date of Service  12/30/2014  HPI/Events of Note  Worsening shock, back on epi gtt  eICU Interventions  ABG- acidosis Bicarb IV x 2, start  Bicarb gtt Doubt IABP candidate rewarm soon     Intervention Category Major Interventions: Shock - evaluation and management  Endy Easterly V. 12/30/2014, 8:34 PM

## 2014-12-30 NOTE — Progress Notes (Signed)
CRITICAL VALUE ALERT  Critical value received: Troponin 4.01  Date of notification:  12/30/14  Time of notification:  0900  Critical value read back:Yes.    Nurse who received alert:  Newman Nickels RN  Post arrest. Post cath.

## 2014-12-30 NOTE — Progress Notes (Signed)
  Echocardiogram 2D Echocardiogram has been performed.  [facility] [name] 12/30/2014, 2:24 PM

## 2014-12-30 NOTE — Progress Notes (Signed)
ANTICOAGULATION CONSULT NOTE - Initial Consult  Pharmacy Consult for Heparin Indication: chest pain/ACS  Allergies  Allergen Reactions  . Statins Cough    Patient Measurements: Height: 6\' 4"  (193 cm) Weight: 273 lb 11.6 oz (124.16 kg) IBW/kg (Calculated) : 86.8 Heparin Dosing Weight: 114 kg  Vital Signs: Temp: 91.6 F (33.1 C) (04/04 2100) Temp Source: Core (Comment) (04/04 1900) BP: 86/43 mmHg (04/04 1907) Pulse Rate: 69 (04/04 1907)  Labs:  Recent Labs  01/09/2015 1453  01/19/2015 1820  12/31/2014 2219 12/30/14 12/30/14 0019 12/30/14 0415 12/30/14 0730 12/30/14 1155 12/30/14 1600 12/30/14 2041  HGB 12.1*  < >  --   < > 15.3 15.0  --  12.9*  --   --   --   --   HCT 36.6*  < >  --   < > 45.0 44.0  --  38.7*  --   --   --   --   PLT 221  --   --   --   --   --   --  221  --   --   --   --   APTT  --   --  43*  --   --   --  46*  --   --   --   --   --   LABPROT  --   --  19.2*  --   --   --  21.6*  --   --   --   --   --   INR  --   --  1.60*  --   --   --  1.86*  --   --   --   --   --   HEPARINUNFRC  --   --   --   --   --   --   --   --   --   --   --  0.26*  CREATININE 6.54*  < >  --   < > 6.00* 5.90*  --  5.79*  5.85* 5.50* 5.13* 4.94*  --   TROPONINI 0.10*  --   --   --   --   --   --   --  4.01*  --   --   --   < > = values in this interval not displayed.  Estimated Creatinine Clearance: 19.7 mL/min (by C-G formula based on Cr of 4.94).   Medical History: Past Medical History  Diagnosis Date  . Erectile dysfunction   . HTN (hypertension)   . Cardiovascular collapse 2011  . ESRD on dialysis     "; TTS" (08/21/2014)  . AICD (automatic cardioverter/defibrillator) present 2011    for syncope with FH of sudden death  . Type II diabetes mellitus   . Pneumonia 07/2014    Archie Endo 08/21/2014  . CHF (congestive heart failure)     Medications:  Prescriptions prior to admission  Medication Sig Dispense Refill Last Dose  . aspirin EC 81 MG tablet Take  81 mg by mouth at bedtime.   12/24/2014  . B Complex-C-Folic Acid (NEPHROCAPS PO) Take 1 capsule by mouth at bedtime.    12/24/2014  . ferrous sulfate 325 (65 FE) MG tablet Take 325 mg by mouth at bedtime.    12/24/2014  . insulin NPH Human (HUMULIN N,NOVOLIN N) 100 UNIT/ML injection Inject 2 Units into the skin daily after supper.   maybe 12/28/14  . loperamide (IMODIUM) 2 MG capsule Take 1  capsule (2 mg total) by mouth 4 (four) times daily as needed for diarrhea or loose stools. 12 capsule 0 couple months ago  . Multiple Vitamin (MULTIVITAMIN WITH MINERALS) TABS tablet Take 1 tablet by mouth daily. Centrum Silver   12/24/2014  . mupirocin ointment (BACTROBAN) 2 % Place 1 application into the nose 3 (three) times daily as needed (tissue breakdown on legs).   maybe 12/28/14  . sevelamer carbonate (RENVELA) 800 MG tablet Take 800 mg by mouth at bedtime.    12/24/2014  . ceFAZolin 2 g in dextrose 5 % 50 mL ivpb Inject 2 g into the vein one time in dialysis. For 2 weeks, end date is Sunday 09/08/14 (Patient not taking: Reported on 12/30/2014) 2 mL 0 Not Taking at Unknown time  . saccharomyces boulardii (FLORASTOR) 250 MG capsule Take 1 capsule (250 mg total) by mouth 2 (two) times daily. (Patient not taking: Reported on 12/30/2014) 60 capsule 0 Not Taking at Unknown time   Scheduled:  . sodium chloride  2,000 mL Intravenous Once  . antiseptic oral rinse  7 mL Mouth Rinse QID  . artificial tears  1 application Both Eyes 3 times per day  . aspirin EC  81 mg Oral Daily  . chlorhexidine  15 mL Mouth Rinse BID  . fentaNYL  50 mcg Intravenous Once  . insulin aspart  0-9 Units Subcutaneous TID WC  . pantoprazole (PROTONIX) IV  40 mg Intravenous Q24H   Infusions:  . sodium chloride    . amiodarone 30 mg/hr (12/30/14 1600)  . cisatracurium (NIMBEX) infusion 1.5 mcg/kg/min (12/30/14 1600)  . dextrose 10 mL/hr at 12/30/14 1600  . epinephrine 4 mcg/min (12/30/14 2100)  . fentaNYL infusion INTRAVENOUS 200 mcg/hr  (12/30/14 2000)  . heparin 1,500 Units/hr (12/30/14 1600)  . midazolam (VERSED) infusion 2 mg/hr (12/30/14 2000)  . norepinephrine (LEVOPHED) Adult infusion 50 mcg/min (12/30/14 1626)  . dialysis replacement fluid (prismasate) 500 mL/hr at 12/30/14 1315  . dialysis replacement fluid (prismasate) 300 mL/hr at 12/30/14 2000  . dialysate (PRISMASATE) 1,500 mL/hr at 12/30/14 2038  .  sodium bicarbonate  infusion 1000 mL    . vasopressin (PITRESSIN) infusion - *FOR SHOCK* 0.03 Units/min (12/30/14 1753)    Assessment: 74yo male with history of ESRD presents following witnessed cardiac arrest, on hypothermia protocol. Pharmacy has been managing IV heparin. Heparin level 0.26 slightly subtherapeutic on 1500 units/hr. No interruption with infusion, no bleeding noted per RN. Started re-warming this evening at ~ 2030.   Goal of Therapy:  Heparin level 0.3-0.7 units/ml Monitor platelets by anticoagulation protocol: Yes   Plan:  Increase heparin rate to 1700 units/hr F/u AM heparin level at [place], PharmD, BCPS  Clinical Pharmacist  Pager: [phone]   12/30/2014,9:15 PM

## 2014-12-30 NOTE — Plan of Care (Signed)
Problem: Phase I Progression Outcomes Goal: Cool target temp reached 2 hrs from start Outcome: Not Met (add Reason) Cooling ice packs not applied pre-cath and surface cooling begun after emergent cardiac cath

## 2014-12-30 NOTE — Progress Notes (Signed)
[place] Progress Note Patient Name: Ryan Campbell DOB: 02-18-1941 MRN: [id number]   Date of Service  12/30/2014  HPI/Events of Note  SUP  eICU Interventions  protonix     Intervention Category Intermediate Interventions: Best-practice therapies (e.g. DVT, beta blocker, etc.)  ALVA,RAKESH V. 12/30/2014, 5:22 PM

## 2014-12-30 NOTE — Progress Notes (Signed)
Spoke with eLink MD about pt BP. Titrating pressors to acheive MAP >80. Pt is max at 50 mcg of Levophed, 0.03 units of Vasopressin and 5 mcg of Epinephrine. MD gave verbal order to titrate to keep  SBP > 120. Will continue to assess and monitor the pt closely.

## 2014-12-30 NOTE — Procedures (Signed)
ELECTROENCEPHALOGRAM REPORT  Patient: Ryan Campbell       Room #: 5J88 EEG No. ID: [id number] Age: 74 y.o.        Sex: male Referring Physician: Elsworth Soho, R Report Date:  12/30/2014        Interpreting Physician: Anthony Sar  History: [name] is an 74 y.o. male status post cardiac arrest on 01/12/2015. He is currently undergoing hypothermia treatment and is sedated, as well as intubated and on mechanical ventilation, and pressor support for blood pressure.  Indications for study:  Assess severity of encephalopathy; rule out seizure activity.  Technique: This is an 18 channel routine scalp EEG performed at the bedside with bipolar and monopolar montages arranged in accordance to the international 10/20 system of electrode placement.   Description: Patient's temperature was noted to be 31.7C at the time of this recording. Predominant cerebral activity consisted of a burst-suppression pattern of low amplitude delta activity diffusely, with brief runs of 2-3 Hz delta activity alternating with runs of markedly suppressed brain activity. Photic stimulation was not performed. No epileptiform discharges were recorded.  Interpretation: This EEG is abnormal with findings consistent with severe encephalopathy, most likely due to anoxic brain injury. Brain activity may also be suppressed due, in part, to hyperthermic state. No evidence of an epileptic disorder was demonstrated.   Rush Farmer M.D. [facility] [phone]

## 2014-12-30 NOTE — Progress Notes (Signed)
Sedation orders changed this AM. Precedex d/c'd and switched to Versed. MD changed versed to be mixed with Dextrose 5% due to low CBGs and the versed that was bedside was Versed mixed in 0.9% NS. Pt on paralytic and needed to have continuous sedation verified with 2 RNs to spike versed with 0.9% NS and run until the Versed in Dextrose 5% arrived.   Wasted 40 mL of versed in sink with Loa Socks RN

## 2014-12-30 NOTE — Progress Notes (Signed)
CVVHD management.  On 2 pressors.  Metabolically stable.  To have heparin started by cardio at noon.  Pulling 50-100 cc/hr as BP allows.  Recheck labs this afternoon.

## 2014-12-30 NOTE — Progress Notes (Signed)
ANTICOAGULATION CONSULT NOTE - Initial Consult  Pharmacy Consult for Heparin Indication: chest pain/ACS  Allergies  Allergen Reactions  . Statins Cough    Patient Measurements: Height: 6\' 4"  (193 cm) Weight: 280 lb (127.007 kg) IBW/kg (Calculated) : 86.8 Heparin Dosing Weight: 114 kg  Vital Signs: Temp: 91.2 F (32.9 C) (04/04 0300) Temp Source: Core (Comment) (04/04 0300) BP: 135/72 mmHg (04/04 0319) Pulse Rate: 72 (04/04 0319)  Labs:  Recent Labs  01/22/2015 1453  01/22/2015 1820  01/09/2015 1951 01/21/2015 2219 12/30/14 12/30/14 0019  HGB 12.1*  < >  --   < > 14.6 15.3 15.0  --   HCT 36.6*  < >  --   < > 43.0 45.0 44.0  --   PLT 221  --   --   --   --   --   --   --   APTT  --   --  43*  --   --   --   --  46*  LABPROT  --   --  19.2*  --   --   --   --  21.6*  INR  --   --  1.60*  --   --   --   --  1.86*  CREATININE 6.54*  < >  --   < > 5.60* 6.00* 5.90*  --   TROPONINI 0.10*  --   --   --   --   --   --   --   < > = values in this interval not displayed.  Estimated Creatinine Clearance: 16.7 mL/min (by C-G formula based on Cr of 5.9).   Medical History: Past Medical History  Diagnosis Date  . Erectile dysfunction   . HTN (hypertension)   . Cardiovascular collapse 2011  . ESRD on dialysis     "[facility]; TTS" (08/21/2014)  . AICD (automatic cardioverter/defibrillator) present 2011    for syncope with FH of sudden death  . Type II diabetes mellitus   . Pneumonia 07/2014    Archie Endo 08/21/2014  . CHF (congestive heart failure)     Medications:  Prescriptions prior to admission  Medication Sig Dispense Refill Last Dose  . aspirin 81 MG tablet Take 81 mg by mouth daily.   Past Month at Unknown time  . B Complex-C-Folic Acid (NEPHROCAPS PO) Take 1 capsule by mouth daily.   Past Week at Unknown time  . ceFAZolin 2 g in dextrose 5 % 50 mL ivpb Inject 2 g into the vein one time in dialysis. For 2 weeks, end date is Sunday 09/08/14 2 mL 0 Past Week at Unknown time   . ferrous sulfate 325 (65 FE) MG tablet Take 325 mg by mouth 2 (two) times daily.   Past Week at Unknown time  . insulin regular (NOVOLIN R,HUMULIN R) 100 units/mL injection Inject 2 Units into the skin daily after supper.   Past Week at Unknown time  . loperamide (IMODIUM) 2 MG capsule Take 1 capsule (2 mg total) by mouth 4 (four) times daily as needed for diarrhea or loose stools. 12 capsule 0 Past Month at Unknown time  . metoprolol (LOPRESSOR) 50 MG tablet Take 50 mg by mouth 2 (two) times daily.   Past Month at Unknown time  . Multiple Vitamins-Minerals (CENTRUM SILVER PO) Take 1 capsule by mouth daily.    Past Week at Unknown time  . saccharomyces boulardii (FLORASTOR) 250 MG capsule Take 1 capsule (250 mg total) by mouth  2 (two) times daily. 60 capsule 0 Past Month at Unknown time  . sevelamer carbonate (RENVELA) 800 MG tablet Take 1,600 mg by mouth daily.    Past Week at Unknown time   Scheduled:  . sodium chloride  2,000 mL Intravenous Once  . antiseptic oral rinse  7 mL Mouth Rinse QID  . artificial tears  1 application Both Eyes 3 times per day  . aspirin EC  81 mg Oral Daily  . atropine      . chlorhexidine  15 mL Mouth Rinse BID  . fentaNYL  50 mcg Intravenous Once   Infusions:  . sodium chloride    . amiodarone 30 mg/hr (12/30/14 0314)  . cisatracurium (NIMBEX) infusion 1.5 mcg/kg/min (01/02/2015 2000)  . dexmedetomidine 0.8 mcg/kg/hr (12/30/14 0330)  . epinephrine 1 mcg/min (01/16/2015 2000)  . fentaNYL infusion INTRAVENOUS 300 mcg/hr (12/30/14 0229)  . norepinephrine (LEVOPHED) Adult infusion 5 mcg/min (12/30/14 0000)  . dialysis replacement fluid (prismasate) 500 mL/hr at 12/30/14 0224  . dialysis replacement fluid (prismasate) 300 mL/hr at 12/30/14 0225  . dialysate (PRISMASATE) 1,500 mL/hr at 12/30/14 0225  . vasopressin (PITRESSIN) infusion - *FOR SHOCK* 0.03 Units/min (01/04/2015 2000)    Assessment: 74yo male with history of ESRD presents following witnessed cardiac  arrest. Pharmacy was consulted to dose heparin for ACS/chest pain 8 hours post sheath removal. Sheath was removed at 0345. Hgb 15, Plt 221, sCr 5.9, INR 1.86.  Goal of Therapy:  Heparin level 0.3-0.7 units/ml Monitor platelets by anticoagulation protocol: Yes   Plan:  Start heparin infusion at 1500 units/hr Check anti-Xa level in 8 hours and daily while on heparin Continue to monitor H&H and platelets  Andrey Cota. Diona Foley, PharmD Clinical Pharmacist Pager [phone] 12/30/2014,4:02 AM

## 2014-12-30 NOTE — Progress Notes (Signed)
ANTICOAGULATION CONSULT NOTE - Follow Up Consult  Pharmacy Consult for heparin Indication: chest pain/ACS  Allergies  Allergen Reactions  . Statins Cough    Patient Measurements: Height: 6\' 4"  (193 cm) Weight: 278 lb 14.1 oz (126.5 kg) IBW/kg (Calculated) : 86.8 Heparin Dosing Weight: 114kg  Vital Signs: Temp: 95.4 F (35.2 C) (04/05 0600) Temp Source: Core (Comment) (04/05 0400) BP: 119/58 mmHg (04/05 0343) Pulse Rate: 72 (04/05 0630)  Labs:  Recent Labs  01/21/2015 1453  01/22/2015 1820  12/30/14 12/30/14 0019 12/30/14 0415 12/30/14 0730  12/30/14 2041 12/30/14 2110 12/31/14 0007 12/31/14 0420 12/31/14 0425 12/31/14 0430  HGB 12.1*  < >  --   < > 15.0  --  12.9*  --   --   --   --   --   --  12.4*  --   HCT 36.6*  < >  --   < > 44.0  --  38.7*  --   --   --   --   --   --  39.9  --   PLT 221  --   --   --   --   --  221  --   --   --   --   --   --  181  --   APTT  --   --  43*  --   --  46*  --   --   --   --   --   --   --   --   --   LABPROT  --   --  19.2*  --   --  21.6*  --   --   --   --   --   --   --   --   --   INR  --   --  1.60*  --   --  1.86*  --   --   --   --   --   --   --   --   --   HEPARINUNFRC  --   --   --   --   --   --   --   --   --  0.26*  --   --   --   --  0.53  CREATININE 6.54*  < >  --   < > 5.90*  --  5.79*  5.85* 5.50*  < >  --  4.81* 4.47* 4.22*  --   --   TROPONINI 0.10*  --   --   --   --   --   --  4.01*  --   --   --   --   --   --   --   < > = values in this interval not displayed.  Estimated Creatinine Clearance: 23.3 mL/min (by C-G formula based on Cr of 4.22).   Medications:  Infusions:  . sodium chloride 10 mL/hr at 12/30/14 2144  . amiodarone Stopped (12/31/14 0215)  . cisatracurium (NIMBEX) infusion 1.5 mcg/kg/min (12/31/14 0348)  . dextrose 10 mL/hr at 12/30/14 1600  . epinephrine 12 mcg/min (12/31/14 0349)  . fentaNYL infusion INTRAVENOUS 200 mcg/hr (12/30/14 2247)  . heparin 1,700 Units/hr (12/31/14 0249)  .  midazolam (VERSED) infusion 2 mg/hr (12/31/14 0300)  . norepinephrine (LEVOPHED) Adult infusion 50 mcg/min (12/31/14 0525)  . phenylephrine (NEO-SYNEPHRINE) Adult infusion 150 mcg/min (12/31/14 0631)  . dialysis replacement fluid (prismasate) 500 mL/hr at 12/30/14  2340  . dialysis replacement fluid (prismasate) 300 mL/hr at 12/30/14 2000  . dialysate (PRISMASATE) 1,500 mL/hr at 12/31/14 0309  .  sodium bicarbonate  infusion 1000 mL 125 mL/hr at 12/31/14 0116  . vasopressin (PITRESSIN) infusion - *FOR SHOCK* 0.03 Units/min (12/30/14 1753)    Assessment: 73yoM w/ ESRD presented following cardiac arrest, on hypothermia protocol, rewarming. Pharmacy managing IV heparin. HL this morning therapeutic on 1700units/hr. No bleeding noted. Hgb 12.4, plts 181.   Goal of Therapy:  Heparin level 0.3-0.7 units/ml Monitor platelets by anticoagulation protocol: Yes   Plan:  Continue Heparin 1700 units/hr 4hr HL while rewarming @ 900 Daily HL/CBC Monitor s/sx of bleeding Monitor rewarming process  Thank you for allowing pharmacy to be part of this patient's care team  [place], Pharm.D Clinical Pharmacy Resident Pager: [phone] 12/31/2014 .6:43 AM

## 2014-12-30 NOTE — Progress Notes (Signed)
74 year old gentleman with long-standing end-stage renal disease on dialysis history of ICD placement for presumed cardiac arrest or syncope - no documentation of existing ischemic evaluation or catheterization prior to his ICD. He was admitted on April 3 for witnessed cardiac arrest at the [facility].  He had roughly 8 minutes of CPR prior to EMS arrival and was defibrillated within 2 minutes. There were some suggestion of inferior ST elevations and he was referred for cardiac catheterization with results and globe. Last known echo EF of 40-45%, by LV gram is now 35%.  Subjective:  Had significant blood pressure issues overnight requiring the use of epinephrine drip. Currently on minimal epinephrine with hopes to wean in addition to the defect was a suppressant. Has had significant ectopy but no recurrent VT Remains on amiodarone.  Objective:  Vital Signs in the last 24 hours: Temp:  [88.3 F (31.3 C)-96.1 F (35.6 C)] 91 F (32.8 C) (04/04 1800) Pulse Rate:  [25-107] 69 (04/04 1907) Resp:  [13-21] 13 (04/04 1907) BP: (86-153)/(43-98) 86/43 mmHg (04/04 1907) SpO2:  [91 %-100 %] 97 % (04/04 1627) Arterial Line BP: (108-161)/(63-95) 131/65 mmHg (04/04 1800) FiO2 (%):  [70 %-100 %] 70 % (04/04 1907) Weight:  [273 lb 11.6 oz (124.16 kg)] 273 lb 11.6 oz (124.16 kg) (04/04 0500)  Intake/Output from previous day: 04/03 0701 - 04/04 0700 In: 1125.1 [I.V.:1095.1; NG/GT:30] Out: 609  Intake/Output from this shift:    Physical Exam: General appearance: Intubated and sedated. Neck: no adenopathy, no carotid bruit and no JVD Lungs: Coarse tubular breath sounds throughout. Heart: Regular rate and rhythm but with frequent ectopy. No obvious M./R./G. - cannot exclude HSM at apex Abdomen: soft, non-tender; bowel sounds normal; no masses,  no organomegaly Extremities: Bilateral pitting edema with diffuse scaly skin bilateral lower extremities. Pulses: 1+ distal pulses, cool to  palpation Neurologic: Mental status: Intubated and sedated  Lab Results:  Recent Labs  01/22/2015 1453  12/30/14 12/30/14 0415  WBC 6.5  --   --  8.5  HGB 12.1*  < > 15.0 12.9*  PLT 221  --   --  221  < > = values in this interval not displayed.  Recent Labs  12/30/14 1155 12/30/14 1600  NA 135 136  K 5.2* 5.0  CL 100 98  CO2 18* 16*  GLUCOSE 68* 88  BUN 25* 24*  CREATININE 5.13* 4.94*    Recent Labs  12/31/2014 1453 12/30/14 0730  TROPONINI 0.10* 4.01*   Hepatic Function Panel  Recent Labs  01/08/2015 1453  12/30/14 1600  PROT 8.4*  --   --   ALBUMIN 3.4*  < > 3.2*  AST 34  --   --   ALT 14  --   --   ALKPHOS 83  --   --   BILITOT <0.1*  --   --   < > = values in this interval not displayed. No results for input(s): CHOL in the last 72 hours. No results for input(s): PROTIME in the last 72 hours.  Imaging: Imaging results have been reviewed  Cardiac Cath: 4/3  Severe multivessel CAD: 30% proximal LAD, 90% ostial D1, 80% mid LAD with distal occlusion at apex. CTL OM1/RI and OM 2 with our ID filled via collaterals. 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 It is hard to tell what is the acute culprit vessel lesion. Cannot exclude occlusion of the RCA versus either circumflex branches.  The other potential PCI option would be involving the LAD, this is a diffusely calcified region and the LAD itself is occluded at the apex.   At this point we would need to followup with his mental  status to see if he has any potential for meaningful recovery prior to engaging in any high risk for difficulties see if. I do not think he be very much a CABG option as the target and I don't think that there is much targets in the circumflex system either.  Would check 2-D echocardiogram to get a better assessment of EF once he is off hypothermia protocol  Would continue with IV amiodarone loading. As long as he is not taking by mouth would continue with IV amiodarone. Would potentially convert to via tube once able. - to avoid significant ectopy would try to wean off of the epinephrine drip  He doesn't seem to have much in the way of acute heart failure findings. Volumes are being controlled but CVVHD Unsure if the cardiac arrest was ischemic mediated versus existing pathology mediated.  From an overall standpoint the patient is critically ill CT scan and neurology evaluation suggests severe anoxic brain injury. Poor overall prognosis. For now we will simply monitor for signs of arrhythmia and for signs of neurologic recovery.   LOS: 1 day    HARDING, DAVID W 12/30/2014, 7:20 PM

## 2014-12-30 NOTE — Progress Notes (Addendum)
Pt K 5.2 on 1200 BMET. Notified Nephrology. Advised to monitor and if K keeps rising re-notify nephrology and they will look at CRRT settings.

## 2014-12-30 NOTE — Progress Notes (Signed)
Right arterial sheath pulled at 0345. Pressure held for 25 minutes. Pt paralyzed, tolerated well. Site clean, dry, intact. Will continue to monitor.

## 2014-12-30 NOTE — Progress Notes (Addendum)
INITIAL NUTRITION ASSESSMENT  DOCUMENTATION CODES Per approved criteria  -Obesity Unspecified   INTERVENTION: Once pt is rewarmed recommend: Initiate Vital High Protein @ 20 ml/hr via OG tube   60 ml Prostat five times per day.    Tube feeding regimen provides 1480 kcal (12 kcal/kg of actual body weight), 192 grams of protein, and 401 ml of H2O.   NUTRITION DIAGNOSIS: Inadequate oral intake related to inability to eat as evidenced by NPO status  Goal: Enteral nutrition to provide 65-70% of estimated calorie needs based on ASPEN guidelines for hypocaloric, high protein feeding in critically ill obese individuals  Monitor:  Vent status, TF initiation, labs  Reason for Assessment: Pt identified as at nutrition risk on the Malnutrition Screen Tool/Vetnilator   ASSESSMENT: Pt with hx of CHF and ESRD on HD admitted after cardiac arrest. Pt on hypothermia protocol on multiple pressors and started on CRRT.   Patient is currently intubated on ventilator support MV: 7.8 L/min Temp (24hrs), Avg:91.8 F (33.2 C), Min:88.3 F (31.3 C), Max:97.3 F (36.3 C)  Pt on hypothermia protocol, cooling reached 4/3 at 2200 and would not be ready for TF until 4/5.  Labs: Potassium and BUN/Cr elevated No signs of fat or muscle depletion noted on exam. Pt does have edema and per RN his abdomen is distended.  Per chart review no recent weight loss noted, unsure of usual appetite.   Height: Ht Readings from Last 1 Encounters:  12/27/2014 6\' 4"  (1.93 m)    Weight: Wt Readings from Last 1 Encounters:  12/30/14 273 lb 11.6 oz (124.16 kg)    Ideal Body Weight: 91.8 kg   % Ideal Body Weight: 135%  Wt Readings from Last 10 Encounters:  12/30/14 273 lb 11.6 oz (124.16 kg)  12/09/14 270 lb (122.471 kg)  08/25/14 262 lb 5.6 oz (119 kg)  04/22/14 274 lb 12.8 oz (124.648 kg)  03/20/14 276 lb 1.9 oz (125.247 kg)  07/06/13 272 lb (123.378 kg)  03/07/13 277 lb 12.8 oz (126.009 kg)    Usual Body  Weight: 270s  % Usual Body Weight: 100%  BMI:  Body mass index is 33.33 kg/(m^2).  Estimated Nutritional Needs: Kcal: 2952-8413 Protein: >/= 183 grams Fluid: > 1.2 L/day  Skin: abrasion  Diet Order: Diet NPO time specified  EDUCATION NEEDS: -No education needs identified at this time   Intake/Output Summary (Last 24 hours) at 12/30/14 1449 Last data filed at 12/30/14 1400  Gross per 24 hour  Intake 1924.84 ml  Output   1688 ml  Net 236.84 ml    Last BM: PTA   Labs:   Recent Labs Lab 12/30/14 0415 12/30/14 0730 12/30/14 1155  NA 137  137 136 135  K 4.3  4.4 4.4 5.2*  CL 100  100 100 100  CO2 22  23 19  18*  BUN 28*  28* 27* 25*  CREATININE 5.79*  5.85* 5.50* 5.13*  CALCIUM 8.4  8.4 8.2* 8.3*  MG 2.0  --   --   PHOS 6.0*  --   --   GLUCOSE 116*  117* 98 68*    CBG (last 3)   Recent Labs  12/30/14 0559 12/30/14 0738 12/30/14 1007  GLUCAP 99 99 77    Scheduled Meds: . sodium chloride  2,000 mL Intravenous Once  . antiseptic oral rinse  7 mL Mouth Rinse QID  . artificial tears  1 application Both Eyes 3 times per day  . aspirin EC  81 mg Oral  Daily  . chlorhexidine  15 mL Mouth Rinse BID  . fentaNYL  50 mcg Intravenous Once  . insulin aspart  0-9 Units Subcutaneous TID WC    Continuous Infusions: . sodium chloride    . amiodarone 30 mg/hr (12/30/14 1417)  . cisatracurium (NIMBEX) infusion 1.5 mcg/kg/min (12/30/14 0533)  . dextrose 10 mL/hr at 12/30/14 1209  . epinephrine Stopped (12/30/14 0830)  . fentaNYL infusion INTRAVENOUS 300 mcg/hr (12/30/14 1215)  . heparin 1,500 Units/hr (12/30/14 1123)  . midazolam (VERSED) infusion 3 mg/hr (12/30/14 1335)  . norepinephrine (LEVOPHED) Adult infusion 30 mcg/min (12/30/14 1400)  . dialysis replacement fluid (prismasate) 500 mL/hr at 12/30/14 1315  . dialysis replacement fluid (prismasate) 300 mL/hr at 12/30/14 0225  . dialysate (PRISMASATE) 1,500 mL/hr at 12/30/14 1342  . vasopressin  (PITRESSIN) infusion - *FOR SHOCK* 0.03 Units/min (01/07/2015 2000)    [place], [place], [place] Pager [phone] After Hours Pager

## 2014-12-30 NOTE — Procedures (Signed)
Central Venous Catheter Insertion Procedure Note Ryan Campbell [id number] 02-04-1941  Procedure: Insertion of Central Venous Catheter Indications: CVV initiation  Procedure Details Consent: Risks of procedure as well as the alternatives and risks of each were explained to the (patient/caregiver).  Consent for procedure obtained. Time Out: Verified patient identification, verified procedure, site/side was marked, verified correct patient position, special equipment/implants available, medications/allergies/relevent history reviewed, required imaging and test results available.  Performed  Maximum sterile technique was used including antiseptics, cap, gloves, gown, hand hygiene, mask and sheet. Skin prep: Chlorhexidine; local anesthetic administered A antimicrobial bonded/coated triple lumen 20cm dialysis catheter was placed in the left femoral vein due to multiple attempts, no other available access using the Seldinger technique.  Initially, the LIJ was selected and identified via ultrasound.  The vessel was accessed easily on first attempt but despite multiple attempts the wire could not be passed beyond approx 10cm.  Therefore, we converted to a L femoral approach and the line was placed under ultrasound guidance without difficulties.    Evaluation Blood flow good Complications: No apparent complications Patient did tolerate procedure well. Line ready to use.  No CXR needed as it is a femoral line.   CALAB, SACHSE 12/30/2014, 1:50 AM

## 2014-12-30 NOTE — Progress Notes (Signed)
Called to the bedside for evaluation of progressive hypotension and increased pressor requirement.  MAP in the low 60's, but unable to achieve protocol MAP of 70. On CVVHD - multivessel CAD with witnessed arrest. Discussed the case with Dr. Elsworth Soho - I've noted he is acidemic - will re-check gas, may need to increase bicarb in dialysate. Would consider switching to "warm arm" of the hypothermia protocol - as sedation and paralysis may be playing a role. ?whether IABP would be appropriate support. Discussed with Dr. Tamala Julian - EF is relatively similar to prior study, troponin elevation was minimal - there was no evidence for occluded vessel which could have lead to a cardiogenic shock situation. We do not feel an IABP would contribute to overall outcome with regards to shock.  Pixie Casino, MD, [facility] Attending Cardiologist [place]

## 2014-12-30 NOTE — Progress Notes (Signed)
EEG completed; results pending.    

## 2014-12-30 NOTE — Progress Notes (Signed)
[place] Progress Note Patient Name: Ryan Campbell DOB: Jun 28, 1941 MRN: [id number]   Date of Service  12/30/2014  HPI/Events of Note  Patient with ongoing metabolic acidosis with pH 7.16/40/65/15.  Vent rate of 14.  eICU Interventions  Plan: 1. Increase vent rate to 30 while on paralytic 2. 2 additional amps of Bicarb IVP 3. Recheck ABG in 1 hour post vent change     Intervention Category Major Interventions: Acid-Base disturbance - evaluation and management  Derik Fults 12/30/2014, 11:39 PM

## 2014-12-30 NOTE — Progress Notes (Signed)
PULMONARY / CRITICAL CARE MEDICINE   Name: COE ANGELOS MRN: [id number] DOB: June 09, 1941    ADMISSION DATE:  01/18/2015  CHIEF COMPLAINT:  Cardiac arrest  INITIAL PRESENTATION: 74 y M pmh systolic CHF 40-98%, hx of syncope and cardiac arrest back in 2011 with fam hx of syncope unknown coronary anatomy previous to this arrest, ESRD on HD, HTN presnted after witness cardiac arrest 2 with bystander CPR x8 min and then EMS additional 20 min cPR until ROSC. Pt cathed then cooled.   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, RCA, OMs 4/3/: CT 01/04/2015 global cerebral ischemia, no hemorrhage or mass lesion, chronic sinusitis   SIGNIFICANT EVENTS: 01/22/2015: cardiac arrest with bystander CPR estimated down time 8 min 4/416: pt on hypothermia protocol, multiple pressors (epi, norepi, vaso) and amio  SUBJECTIVE: Pt sedated, cooled started on CRRT  VITAL SIGNS: Temp:  [89.8 F (32.1 C)-97.3 F (36.3 C)] 89.8 F (32.1 C) (04/04 0700) Pulse Rate:  [25-131] 60 (04/04 0700) Resp:  [14-27] 18 (04/04 0700) BP: (51-169)/(35-102) 135/72 mmHg (04/04 0319) SpO2:  [92 %-100 %] 100 % (04/04 0700) Arterial Line BP: (58-161)/(37-95) 129/71 mmHg (04/04 0700) FiO2 (%):  [50 %-100 %] 90 % (04/04 0319) Weight:  [270 lb (122.471 kg)-280 lb (127.007 kg)] 273 lb 11.6 oz (124.16 kg) (04/04 0500) HEMODYNAMICS: CVP:  [23 mmHg-41 mmHg] 28 mmHg VENTILATOR SETTINGS: Vent Mode:  [-] PRVC FiO2 (%):  [50 %-100 %] 90 % Set Rate:  [14 bmp] 14 bmp Vt Set:  [550 mL-650 mL] 550 mL PEEP:  [5 cmH20] 5 cmH20 Plateau Pressure:  [23 cmH20-27 cmH20] 24 cmH20 INTAKE / OUTPUT:  Intake/Output Summary (Last 24 hours) at 12/30/14 0727 Last data filed at 12/30/14 0700  Gross per 24 hour  Intake 1125.1 ml  Output    609 ml  Net  516.1 ml    PHYSICAL EXAMINATION: General: resting in bed, sedated, cooled HEENT: PERRL, no scleral icterus Cardiac: RRR, no rubs, murmurs or gallops Pulm: clear to  auscultation bilaterally, no crackles or wheezes, moving normal volumes of air Abd: soft, nontender, nondistended, BS present Ext: cool, no pedal edema Neuro: sedated  LABS:  CBC  Recent Labs Lab 01/08/2015 1453  12/30/2014 2219 12/30/14 12/30/14 0415  WBC 6.5  --   --   --  8.5  HGB 12.1*  < > 15.3 15.0 12.9*  HCT 36.6*  < > 45.0 44.0 38.7*  PLT 221  --   --   --  221  < > = values in this interval not displayed. Coag's  Recent Labs Lab 12/31/2014 1820 12/30/14 0019  APTT 43* 46*  INR 1.60* 1.86*   BMET  Recent Labs Lab 01/08/2015 1453  01/06/2015 2219 12/30/14 12/30/14 0415  NA 138  < > 138 138 137  137  K 3.2*  < > 3.5 3.8 4.3  4.4  CL 94*  < > 99 98 100  100  CO2 26  --   --   --  22  23  BUN 24*  < > 29* 30* 28*  28*  CREATININE 6.54*  < > 6.00* 5.90* 5.79*  5.85*  GLUCOSE 97  < > 132* 137* 116*  117*  < > = values in this interval not displayed. Electrolytes  Recent Labs Lab 01/12/2015 1453 12/30/14 0415  CALCIUM 9.2 8.4  8.4  MG  --  2.0  PHOS  --  6.0*   Sepsis Markers  Recent Labs  Lab 12/30/2014 1503  LATICACIDVEN 5.53*   ABG  Recent Labs Lab 01/11/2015 1513 01/20/2015 1848 12/30/14 0545  PHART 7.378 7.362 7.303*  PCO2ART 37.7 49.3* 37.3  PO2ART 145.0* 112.0* 72.0*   Liver Enzymes  Recent Labs Lab 01/11/2015 1453 12/30/14 0415  AST 34  --   ALT 14  --   ALKPHOS 83  --   BILITOT <0.1*  --   ALBUMIN 3.4* 3.1*   Cardiac Enzymes  Recent Labs Lab 01/18/2015 1453  TROPONINI 0.10*   Glucose  Recent Labs Lab 01/08/2015 2108 01/22/2015 2216 12/28/2014 2305 01/14/2015 2358 12/30/14 0201 12/30/14 0419  GLUCAP 126* 123* 123* 125* 109* 100*    Imaging Ct Head Wo Contrast  01/25/2015   CLINICAL DATA:  Traumatic head injury with lacerations. Cardiac arrest this afternoon. Intubated and unresponsive. End-stage renal disease.  EXAM: CT HEAD WITHOUT CONTRAST  CT CERVICAL SPINE WITHOUT CONTRAST  TECHNIQUE: Multidetector CT imaging of the head  and cervical spine was performed following the standard protocol without intravenous contrast. Multiplanar CT image reconstructions of the cervical spine were also generated.  COMPARISON:  03/21/2014  FINDINGS: CT HEAD FINDINGS  The folds in the cerebellum appears somewhat more effaced than on the prior CT scan but no focal cerebellar abnormality is observed. Brainstem unremarkable in CT appearance. The thalami and basal ganglia appear as expected. Ventricular system and basilar cisterns normal.  Gray-white differentiation throughout much of the cerebral cortex is reduced compared to the prior exam, although no overt sulcal effacement is seen.  No intracranial hemorrhage or mass lesion is identified.  Forehead scalp hematoma noted near the midline. Chronic left maxillary sinusitis with some focal hyperdensity centrally in the sinus. Chronic ethmoid sinusitis.  There is atherosclerotic calcification of the cavernous carotid arteries bilaterally.  CT CERVICAL SPINE FINDINGS  With third spacing of fluid with subcutaneous edema and edema tracking along tissue planes in the neck and upper mediastinum. Coronary atherosclerosis. Left pleural effusion with suspected passive atelectasis or aspiration pneumonitis. Endotracheal intubation. Scattered small lymph nodes in the left lower neck.  Incidental failure of fusion of the posterior arch of C1.  Despite efforts by the technologist and patient, motion artifact is present on today's exam and could not be eliminated. This reduces exam sensitivity and specificity. Uncinate spurring and loss of disc height at C4-5, C5-6, and C6-7. No cervical spine fracture or subluxation is identified. Suspected ossicle just above the right first costovertebral junction.  IMPRESSION: 1. Cerebral gray-white junction faintly seen but much less conspicuous than on the prior exam. This raises concern for global cerebral ischemia. No overt sulcal effacement except perhaps in the cerebellum. 2. No  intracranial hemorrhage or mass lesion identified. 3. Forehead scalp hematoma near the midline. 4. Chronic left maxillary and ethmoid sinusitis. Focal high did in density in the left maxillary sinus is of uncertain significance but may be from chronic inflammation. 5. No acute cervical spine findings. Degenerative cervical spondylosis and degenerative disc disease at several levels. 6. Third spacing of fluid with subcutaneous edema and edema tracking along the spaces of the neck. 7. Left pleural effusion with dependent airspace opacity in the left lung.   Electronically Signed   By: Van Clines M.D.   On: 01/06/2015 17:08   Ct Cervical Spine Wo Contrast  01/23/2015   CLINICAL DATA:  Traumatic head injury with lacerations. Cardiac arrest this afternoon. Intubated and unresponsive. End-stage renal disease.  EXAM: CT HEAD WITHOUT CONTRAST  CT CERVICAL SPINE WITHOUT CONTRAST  TECHNIQUE:  Multidetector CT imaging of the head and cervical spine was performed following the standard protocol without intravenous contrast. Multiplanar CT image reconstructions of the cervical spine were also generated.  COMPARISON:  03/21/2014  FINDINGS: CT HEAD FINDINGS  The folds in the cerebellum appears somewhat more effaced than on the prior CT scan but no focal cerebellar abnormality is observed. Brainstem unremarkable in CT appearance. The thalami and basal ganglia appear as expected. Ventricular system and basilar cisterns normal.  Gray-white differentiation throughout much of the cerebral cortex is reduced compared to the prior exam, although no overt sulcal effacement is seen.  No intracranial hemorrhage or mass lesion is identified.  Forehead scalp hematoma noted near the midline. Chronic left maxillary sinusitis with some focal hyperdensity centrally in the sinus. Chronic ethmoid sinusitis.  There is atherosclerotic calcification of the cavernous carotid arteries bilaterally.  CT CERVICAL SPINE FINDINGS  With third spacing  of fluid with subcutaneous edema and edema tracking along tissue planes in the neck and upper mediastinum. Coronary atherosclerosis. Left pleural effusion with suspected passive atelectasis or aspiration pneumonitis. Endotracheal intubation. Scattered small lymph nodes in the left lower neck.  Incidental failure of fusion of the posterior arch of C1.  Despite efforts by the technologist and patient, motion artifact is present on today's exam and could not be eliminated. This reduces exam sensitivity and specificity. Uncinate spurring and loss of disc height at C4-5, C5-6, and C6-7. No cervical spine fracture or subluxation is identified. Suspected ossicle just above the right first costovertebral junction.  IMPRESSION: 1. Cerebral gray-white junction faintly seen but much less conspicuous than on the prior exam. This raises concern for global cerebral ischemia. No overt sulcal effacement except perhaps in the cerebellum. 2. No intracranial hemorrhage or mass lesion identified. 3. Forehead scalp hematoma near the midline. 4. Chronic left maxillary and ethmoid sinusitis. Focal high did in density in the left maxillary sinus is of uncertain significance but may be from chronic inflammation. 5. No acute cervical spine findings. Degenerative cervical spondylosis and degenerative disc disease at several levels. 6. Third spacing of fluid with subcutaneous edema and edema tracking along the spaces of the neck. 7. Left pleural effusion with dependent airspace opacity in the left lung.   Electronically Signed   By: Van Clines M.D.   On: 01/24/2015 17:08   Dg Chest Port 1 View  01/04/2015   CLINICAL DATA:  Respiratory failure, line placement  EXAM: PORTABLE CHEST - 1 VIEW  COMPARISON:  01/18/2015  FINDINGS: The external pacing pads are noted, obscuring detail. Left-sided AICD in place. Right IJ central line tip terminates over the mid SVC. Endotracheal tube is appropriately positioned. Nasogastric tube tip terminates  below the level of the hemidiaphragms but is not included in the field of view. Moderate enlargement of the cardiac silhouette is noted with crowding of the bronchovascular markings but no focal pulmonary opacity.  IMPRESSION: Cardiomegaly without focal acute finding allowing for technique. If symptoms persist, consider PA and lateral chest radiographs obtained at full inspiration when the patient is clinically able.   Electronically Signed   By: Conchita Paris M.D.   On: 12/28/2014 16:33   Dg Chest Portable 1 View  01/19/2015   CLINICAL DATA:  Cardiac arrest  EXAM: PORTABLE CHEST - 1 VIEW  COMPARISON:  08/21/2014  FINDINGS: Cardiac shadow remains enlarged. A defibrillator is again noted and stable. A nasogastric catheter is seen within stomach. An endotracheal tube is noted approximately 4.6 cm above the carina. The lungs  are clear.  IMPRESSION: Tubes and lines as described.  No acute abnormality noted.   Electronically Signed   By: Inez Catalina M.D.   On: 01/18/2015 15:43     ASSESSMENT / PLAN:  PULMONARY OETT 01/03/2015 in field A: VDRF in setting of cardiac arrrest P:   VAP bundle  CXR clear  CARDIOVASCULAR CVL 4/3 A: witnessed cardiac arrest bystander CPR 8 min and then 22min (total 30 min down), on pressors Hx CHF, syncope s/p defibrillator P:  Pt on hypothermia protocol Wean off epi Continue norepi (would escalate this), cont vasopressin to maintain MAP >85 Continue amio to prevent abberrant conduction beats Cards following to eval for multiple PCI vs CABG (appears to have poor targets)  RENAL A:  ESRD on HD, labs stable this AM P:   Renal following Pt on CVVRT   GASTROINTESTINAL A: no acute events P:   Continue PPI  HEMATOLOGIC A:  Stable no acute bleeding P:  Cont to monitor   INFECTIOUS A:  No acute issues P:   Continue to monitor   ENDOCRINE A:  Pt on insulin PTA P:   cbg q4 Sensitive SSI  NEUROLOGIC A:  AMS post cardiac arrest P:   RASS goal: -4 for  hypothermia CT ruled out other acute causes   FAMILY  - Updates: no family available yet  - Inter-disciplinary family meet or Palliative Care meeting due by:  4/11 day 7  TODAY'S SUMMARY: 74 yo on hypothermia protocol s/p cardiac arrest with bystander CPR (total estimated time down 21min) cath with multivessel disease.    [name], MD IM PGY-3 Pgr: [phone]  Pulmonary and [place] Pager: [phone]  12/30/2014, 7:27 AM   Reviewed above, examined.  74 yo male with cardiac arrest with about 30 min before ROSC.  He is on ventilator and requiring pressors >> will transition off epi to levophed.  CT head showed early changes suggestive of anoxic injury and EEG shows non specific changes, but also suggestive of anoxia.    ETT in place, on hypothermia, and CRRT.  Scattered rhonchi, heart sounds regular, chronic venous changes in lower extremities, RASS -5.  Will continue vent, pressors, CRRT, and re-assess neuro status after rewarming.  CC time by me independent of resident time is 35 minutes.  [name], MD [facility] Pulmonary/Critical Care 12/30/2014, 12:15 PM Pager:  [phone] After 3pm call: [phone]

## 2014-12-30 NOTE — Progress Notes (Signed)
Patient seen & examined this AM- discussed with PCCM.  Full not to follow.  Leonie Man, M.D., M.S. Interventional Cardiologist   Pager # [phone]

## 2014-12-31 ENCOUNTER — Encounter: Payer: Self-pay | Admitting: Internal Medicine

## 2014-12-31 DIAGNOSIS — N186 End stage renal disease: Secondary | ICD-10-CM

## 2014-12-31 LAB — BASIC METABOLIC PANEL
ANION GAP: 21 — AB (ref 5–15)
ANION GAP: 23 — AB (ref 5–15)
Anion gap: 28 — ABNORMAL HIGH (ref 5–15)
Anion gap: 24 — ABNORMAL HIGH (ref 5–15)
BUN: 20 mg/dL (ref 6–23)
BUN: 20 mg/dL (ref 6–23)
BUN: 20 mg/dL (ref 6–23)
BUN: 22 mg/dL (ref 6–23)
CALCIUM: 7.5 mg/dL — AB (ref 8.4–10.5)
CALCIUM: 7.6 mg/dL — AB (ref 8.4–10.5)
CALCIUM: 7.6 mg/dL — AB (ref 8.4–10.5)
CHLORIDE: 93 mmol/L — AB (ref 96–112)
CO2: 10 mmol/L — CL (ref 19–32)
CO2: 15 mmol/L — ABNORMAL LOW (ref 19–32)
CO2: 17 mmol/L — AB (ref 19–32)
CO2: 17 mmol/L — AB (ref 19–32)
CREATININE: 4.27 mg/dL — AB (ref 0.50–1.35)
Calcium: 7.6 mg/dL — ABNORMAL LOW (ref 8.4–10.5)
Chloride: 97 mmol/L (ref 96–112)
Chloride: 96 mmol/L (ref 96–112)
Chloride: 98 mmol/L (ref 96–112)
Creatinine, Ser: 4.47 mg/dL — ABNORMAL HIGH (ref 0.50–1.35)
Creatinine, Ser: 4.31 mg/dL — ABNORMAL HIGH (ref 0.50–1.35)
Creatinine, Ser: 4.08 mg/dL — ABNORMAL HIGH (ref 0.50–1.35)
GFR calc Af Amer: 14 mL/min — ABNORMAL LOW (ref >90)
GFR calc Af Amer: 15 mL/min — ABNORMAL LOW (ref >90)
GFR calc Af Amer: 15 mL/min — ABNORMAL LOW (ref >90)
GFR calc Af Amer: 16 mL/min — ABNORMAL LOW (ref >90)
GFR calc non Af Amer: 12 mL/min — ABNORMAL LOW (ref >90)
GFR calc non Af Amer: 13 mL/min — ABNORMAL LOW (ref >90)
GFR, EST NON AFRICAN AMERICAN: 13 mL/min — AB (ref >90)
GFR, EST NON AFRICAN AMERICAN: 13 mL/min — AB (ref >90)
GLUCOSE: 148 mg/dL — AB (ref 70–99)
GLUCOSE: 188 mg/dL — AB (ref 70–99)
Glucose, Bld: 134 mg/dL — ABNORMAL HIGH (ref 70–99)
Glucose, Bld: 224 mg/dL — ABNORMAL HIGH (ref 70–99)
POTASSIUM: 3.9 mmol/L (ref 3.5–5.1)
POTASSIUM: 3.9 mmol/L (ref 3.5–5.1)
Potassium: 4 mmol/L (ref 3.5–5.1)
Potassium: 4.3 mmol/L (ref 3.5–5.1)
SODIUM: 133 mmol/L — AB (ref 135–145)
SODIUM: 135 mmol/L (ref 135–145)
SODIUM: 136 mmol/L (ref 135–145)
Sodium: 135 mmol/L (ref 135–145)

## 2014-12-31 LAB — RENAL FUNCTION PANEL
ALBUMIN: 2.8 g/dL — AB (ref 3.5–5.2)
ANION GAP: 20 — AB (ref 5–15)
Albumin: 2.9 g/dL — ABNORMAL LOW (ref 3.5–5.2)
Anion gap: 22 — ABNORMAL HIGH (ref 5–15)
BUN: 20 mg/dL (ref 6–23)
BUN: 19 mg/dL (ref 6–23)
CALCIUM: 7.7 mg/dL — AB (ref 8.4–10.5)
CALCIUM: 7.6 mg/dL — AB (ref 8.4–10.5)
CO2: 12 mmol/L — AB (ref 19–32)
CO2: 19 mmol/L (ref 19–32)
Chloride: 98 mmol/L (ref 96–112)
Chloride: 94 mmol/L — ABNORMAL LOW (ref 96–112)
Creatinine, Ser: 4.22 mg/dL — ABNORMAL HIGH (ref 0.50–1.35)
Creatinine, Ser: 3.96 mg/dL — ABNORMAL HIGH (ref 0.50–1.35)
GFR calc Af Amer: 16 mL/min — ABNORMAL LOW (ref >90)
GFR calc non Af Amer: 14 mL/min — ABNORMAL LOW (ref >90)
GFR, EST AFRICAN AMERICAN: 15 mL/min — AB (ref >90)
GFR, EST NON AFRICAN AMERICAN: 13 mL/min — AB (ref >90)
Glucose, Bld: 154 mg/dL — ABNORMAL HIGH (ref 70–99)
Glucose, Bld: 229 mg/dL — ABNORMAL HIGH (ref 70–99)
PHOSPHORUS: 2.9 mg/dL (ref 2.3–4.6)
POTASSIUM: 3.8 mmol/L (ref 3.5–5.1)
POTASSIUM: 4.2 mmol/L (ref 3.5–5.1)
Phosphorus: 6.7 mg/dL — ABNORMAL HIGH (ref 2.3–4.6)
SODIUM: 132 mmol/L — AB (ref 135–145)
Sodium: 133 mmol/L — ABNORMAL LOW (ref 135–145)

## 2014-12-31 LAB — HEPARIN LEVEL (UNFRACTIONATED)
HEPARIN UNFRACTIONATED: 0.53 [IU]/mL (ref 0.30–0.70)
HEPARIN UNFRACTIONATED: 0.4 [IU]/mL (ref 0.30–0.70)
Heparin Unfractionated: 0.49 IU/mL (ref 0.30–0.70)

## 2014-12-31 LAB — POCT I-STAT 3, ART BLOOD GAS (G3+)
ACID-BASE DEFICIT: 12 mmol/L — AB (ref 0.0–2.0)
Bicarbonate: 13.7 mEq/L — ABNORMAL LOW (ref 20.0–24.0)
O2 Saturation: 90 %
PCO2 ART: 27 mmHg — AB (ref 35.0–45.0)
Patient temperature: 33.9
TCO2: 15 mmol/L (ref 0–100)
pH, Arterial: 7.297 — ABNORMAL LOW (ref 7.350–7.450)
pO2, Arterial: 55 mmHg — ABNORMAL LOW (ref 80.0–100.0)

## 2014-12-31 LAB — GLUCOSE, CAPILLARY
Glucose-Capillary: 123 mg/dL — ABNORMAL HIGH (ref 70–99)
Glucose-Capillary: 146 mg/dL — ABNORMAL HIGH (ref 70–99)
Glucose-Capillary: 185 mg/dL — ABNORMAL HIGH (ref 70–99)
Glucose-Capillary: 207 mg/dL — ABNORMAL HIGH (ref 70–99)
Glucose-Capillary: 223 mg/dL — ABNORMAL HIGH (ref 70–99)

## 2014-12-31 LAB — CBC
HEMATOCRIT: 39.9 % (ref 39.0–52.0)
HEMOGLOBIN: 12.4 g/dL — AB (ref 13.0–17.0)
MCH: 30.1 pg (ref 26.0–34.0)
MCHC: 31.1 g/dL (ref 30.0–36.0)
MCV: 96.8 fL (ref 78.0–100.0)
Platelets: 181 10*3/uL (ref 150–400)
RBC: 4.12 MIL/uL — ABNORMAL LOW (ref 4.22–5.81)
RDW: 16 % — ABNORMAL HIGH (ref 11.5–15.5)
WBC: 13.8 10*3/uL — AB (ref 4.0–10.5)

## 2014-12-31 LAB — MAGNESIUM: Magnesium: 2.1 mg/dL (ref 1.5–2.5)

## 2014-12-31 MED ORDER — SODIUM BICARBONATE 8.4 % IV SOLN
50.0000 meq | Freq: Once | INTRAVENOUS | Status: AC
  Administered 2014-12-31: 50 meq via INTRAVENOUS
  Filled 2014-12-31: qty 50

## 2014-12-31 MED ORDER — PHENYLEPHRINE HCL 10 MG/ML IJ SOLN
30.0000 ug/min | INTRAVENOUS | Status: DC
  Administered 2014-12-31 (×2): 200 ug/min via INTRAVENOUS
  Administered 2014-12-31 (×2): 100 ug/min via INTRAVENOUS
  Administered 2015-01-01: 40 ug/min via INTRAVENOUS
  Filled 2014-12-31 (×5): qty 4

## 2014-12-31 MED ORDER — PHENYLEPHRINE HCL 10 MG/ML IJ SOLN
30.0000 ug/min | INTRAVENOUS | Status: DC
  Administered 2014-12-31: 50 ug/min via INTRAVENOUS
  Filled 2014-12-31: qty 1

## 2014-12-31 MED ORDER — MIDAZOLAM BOLUS VIA INFUSION
4.0000 mg | INTRAVENOUS | Status: DC | PRN
  Administered 2014-12-31 (×3): 4 mg via INTRAVENOUS
  Filled 2014-12-31 (×3): qty 4

## 2014-12-31 MED ORDER — INSULIN ASPART 100 UNIT/ML ~~LOC~~ SOLN
1.0000 [IU] | SUBCUTANEOUS | Status: DC
  Administered 2014-12-31: 3 [IU] via SUBCUTANEOUS
  Administered 2014-12-31 – 2015-01-01 (×4): 2 [IU] via SUBCUTANEOUS

## 2014-12-31 MED ORDER — SODIUM CHLORIDE 0.9 % IV SOLN
1.0000 g | Freq: Once | INTRAVENOUS | Status: AC
  Administered 2014-12-31: 1 g via INTRAVENOUS
  Filled 2014-12-31: qty 10

## 2014-12-31 NOTE — Progress Notes (Addendum)
ANTICOAGULATION CONSULT NOTE - Follow Up Consult  Pharmacy Consult for heparin Indication: chest pain/ACS  Allergies  Allergen Reactions  . Statins Cough    Patient Measurements: Height: 6\' 4"  (193 cm) Weight: 278 lb 14.1 oz (126.5 kg) IBW/kg (Calculated) : 86.8 Heparin Dosing Weight: 114kg  Vital Signs: Temp: 96.8 F (36 C) (04/05 0900) Temp Source: Core (Comment) (04/05 0900) BP: 141/56 mmHg (04/05 0733) Pulse Rate: 82 (04/05 0900)  Labs:  Recent Labs  01/08/2015 1453  12/31/2014 1820  12/30/14 12/30/14 0019 12/30/14 0415 12/30/14 0730  12/30/14 2041  12/31/14 0007 12/31/14 0420 12/31/14 0425 12/31/14 0430 12/31/14 0756  HGB 12.1*  < >  --   < > 15.0  --  12.9*  --   --   --   --   --   --  12.4*  --   --   HCT 36.6*  < >  --   < > 44.0  --  38.7*  --   --   --   --   --   --  39.9  --   --   PLT 221  --   --   --   --   --  221  --   --   --   --   --   --  181  --   --   APTT  --   --  43*  --   --  46*  --   --   --   --   --   --   --   --   --   --   LABPROT  --   --  19.2*  --   --  21.6*  --   --   --   --   --   --   --   --   --   --   INR  --   --  1.60*  --   --  1.86*  --   --   --   --   --   --   --   --   --   --   HEPARINUNFRC  --   --   --   --   --   --   --   --   --  0.26*  --   --   --   --  0.53 0.49  CREATININE 6.54*  < >  --   < > 5.90*  --  5.79*  5.85* 5.50*  < >  --   < > 4.47* 4.27*  4.22*  --   --  4.31*  TROPONINI 0.10*  --   --   --   --   --   --  4.01*  --   --   --   --   --   --   --   --   < > = values in this interval not displayed.  Estimated Creatinine Clearance: 22.8 mL/min (by C-G formula based on Cr of 4.31).   Medications:  Infusions:  . sodium chloride 10 mL/hr at 12/30/14 2144  . amiodarone Stopped (12/31/14 0215)  . cisatracurium (NIMBEX) infusion Stopped (12/31/14 0905)  . dextrose 10 mL/hr at 12/30/14 1600  . epinephrine 9 mcg/min (12/31/14 0800)  . fentaNYL infusion INTRAVENOUS 300 mcg/hr (12/31/14 0900)  .  heparin 1,700 Units/hr (12/31/14 0249)  . midazolam (VERSED) infusion 2 mg/hr (12/31/14 0300)  . norepinephrine (  LEVOPHED) Adult infusion 50 mcg/min (12/31/14 0525)  . phenylephrine (NEO-SYNEPHRINE) Adult infusion 100 mcg/min (12/31/14 0700)  . dialysis replacement fluid (prismasate) 500 mL/hr at 12/30/14 2340  . dialysis replacement fluid (prismasate) 300 mL/hr at 12/30/14 2000  . dialysate (PRISMASATE) 1,500 mL/hr at 12/31/14 0647  .  sodium bicarbonate  infusion 1000 mL 125 mL/hr at 12/31/14 0116  . vasopressin (PITRESSIN) infusion - *FOR SHOCK* 0.03 Units/min (12/30/14 1753)    Assessment: 73yoM w/ ESRD presented after witnessed cardiac arrest, on hypothermia protocol. Undergoing rewarming. Pharamcy managing IV heparin. Heparin level this morning therapeutic at 0.53 on 1700 units/hr. Hgb 12.4, plts 181, no bleeding noted.   Goal of Therapy:  Heparin level 0.3-0.7 units/ml Monitor platelets by anticoagulation protocol: Yes   Plan:  Continue heparin at 1700units/hr 4hr HL to confirm while rewarming at 0900 Daily HL/CBC Monitor s/sx of bleeding  Thank you for allowing pharmacy to be part of this patient's care team  [place], Pharm.D Clinical Pharmacy Resident Pager: [phone] 12/31/2014   __________________________________________ ADDENDUM   4hr HL remains therapeutic at 0.49. Patient is now at 36 degrees, per RN not planning to increase temperature further since patient is on CRRT.  Plan: Continue heparin at 1700 units/hr 1200 HL to confirm Daily HL/CBC Monitor s/sx of bleeding F/u GOC  Thank you for allowing pharmacy to be part of this patient's care team  [place], Pharm.D Clinical Pharmacy Resident Pager: [phone] 12/31/2014 .9:28 AM  ________________________________________________  4hr confirmatory HL remains therapeutic at 0.40. No bleeding noted.   Plan: Continue heparin at 1700 units/hr Daily HL/CBC  Monitor s/sx of bleeding  Thank you for  allowing pharmacy to be part of this patient's care team  [place], Pharm.D Clinical Pharmacy Resident Pager: [phone] 12/31/2014 .2:18 PM

## 2014-12-31 NOTE — Progress Notes (Signed)
Spoke with pt's relative in [place] who is cardiologist, and explained gravity of Ryan Campbell's status.  He has requested palliative care consultation to assist family in making further decisions about goals of care and also to assist with family dynamics between Ryan Campbell's second wife and his children from previous marriage.  Ryan Mires, MD [facility] Pulmonary/Critical Care 12/31/2014, 11:11 AM Pager:  [phone] After 3pm call: [phone]

## 2014-12-31 NOTE — Progress Notes (Signed)
Called to see patient by RN.  Patient had earlier problems with blood pressures.  He remains on max 3 vasopressors.  He has been receiving amps of bicarb for acidosis.  He has started rewarming.  His SBP 50-60s. Family was called, multiple members were present.  Recent wife, sons and daughters present.  I updated them on current clinical condition and managements.  All questions were answered.  Family was not in place to discuss transition of goals of care.  They wanted to continue aggress measures.  Patient remains full code.  Main addition to management was addition of IVF bolus.

## 2014-12-31 NOTE — Progress Notes (Addendum)
Seizure like activity noted, MD notified, orders received versed bolus given, gtt rate increased, no more siezure like activity noted

## 2014-12-31 NOTE — Progress Notes (Signed)
PULMONARY / CRITICAL CARE MEDICINE   Name: Ryan Campbell MRN: [id number] DOB: [birth year]    ADMISSION DATE:  12/31/2014  CHIEF COMPLAINT:  Cardiac arrest  INITIAL PRESENTATION: 21 y M pmh systolic CHF 63-84%, hx of syncope and cardiac arrest back in 2011 with fam hx of syncope unknown coronary anatomy previous to this arrest, ESRD on HD, HTN presnted after witness cardiac arrest 2 with bystander CPR x8 min and then EMS additional 20 min cPR until ROSC. Pt cathed then cooled.   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, RCA, OMs 4/3: CT 01/06/2015 global cerebral ischemia, no hemorrhage or mass lesion, chronic sinusitis   SIGNIFICANT EVENTS: 01/25/2015: cardiac arrest with bystander CPR estimated down time 8 min 4/416: pt on hypothermia protocol, multiple pressors (epi, norepi, vaso) and amio 12/31/14: continued hypotension and acidosis, rewarming   SUBJECTIVE:  On multiple pressors, CRRT.  Rewarming.  VITAL SIGNS: Temp:  [88.3 F (31.3 C)-95.4 F (35.2 C)] 95.4 F (35.2 C) (04/05 0600) Pulse Rate:  [25-76] 76 (04/05 0700) Resp:  [13-30] 30 (04/05 0700) BP: (86-139)/(43-70) 119/58 mmHg (04/05 0343) SpO2:  [84 %-100 %] 99 % (04/05 0700) Arterial Line BP: (40-163)/(28-74) 147/55 mmHg (04/05 0700) FiO2 (%):  [70 %-100 %] 100 % (04/05 0343) Weight:  [278 lb 14.1 oz (126.5 kg)] 278 lb 14.1 oz (126.5 kg) (04/05 0402) HEMODYNAMICS: CVP:  [23 mmHg-30 mmHg] 29 mmHg VENTILATOR SETTINGS: Vent Mode:  [-] PRVC FiO2 (%):  [70 %-100 %] 100 % Set Rate:  [14 bmp-30 bmp] 30 bmp Vt Set:  [550 mL] 550 mL PEEP:  [5 cmH20-8 cmH20] 8 cmH20 Plateau Pressure:  [24 cmH20-31 cmH20] 31 cmH20 INTAKE / OUTPUT:  Intake/Output Summary (Last 24 hours) at 12/31/14 0710 Last data filed at 12/31/14 0700  Gross per 24 hour  Intake 5486.52 ml  Output   5408 ml  Net  78.52 ml    PHYSICAL EXAMINATION: General: resting in bed, sedated, rewarming HEENT: PERRL, no scleral  icterus Cardiac: RRR, no rubs, murmurs or gallops Pulm: clear to auscultation bilaterally, no crackles or wheezes, moving normal volumes of air Abd: soft, nontender, nondistended, BS present Ext: cool, no pedal edema, no areas of necrosis noted Neuro: sedated  LABS:  CBC  Recent Labs Lab 12/27/2014 1453  12/30/14 12/30/14 0415 12/31/14 0425  WBC 6.5  --   --  8.5 13.8*  HGB 12.1*  < > 15.0 12.9* 12.4*  HCT 36.6*  < > 44.0 38.7* 39.9  PLT 221  --   --  221 181  < > = values in this interval not displayed. Coag's  Recent Labs Lab 01/16/2015 1820 12/30/14 0019  APTT 43* 46*  INR 1.60* 1.86*   BMET  Recent Labs Lab 12/30/14 2110 12/31/14 0007 12/31/14 0420  NA 132* 135 132*  K 3.7 3.9 3.8  CL 97 97 98  CO2 16* 17* 12*  BUN 23 22 20   CREATININE 4.81* 4.47* 4.22*  GLUCOSE 116* 134* 154*   Electrolytes  Recent Labs Lab 12/30/14 0415  12/30/14 1600 12/30/14 2110 12/31/14 0007 12/31/14 0420  CALCIUM 8.4  8.4  < > 8.3* 7.7* 7.6* 7.7*  MG 2.0  --   --   --   --  2.1  PHOS 6.0*  --  7.0*  --   --  6.7*  < > = values in this interval not displayed. Sepsis Markers  Recent Labs Lab 01/18/2015 1503 12/30/14 1948 12/30/14 2002  LATICACIDVEN  5.53* 8.94* 8.90*   ABG  Recent Labs Lab 12/30/14 0545 12/30/14 2019 12/31/14 0058  PHART 7.303* 7.163* 7.297*  PCO2ART 37.3 40.5 27.0*  PO2ART 72.0* 65.0* 55.0*   Liver Enzymes  Recent Labs Lab 01/10/2015 1453 12/30/14 0415 12/30/14 1600 12/31/14 0420  AST 34  --   --   --   ALT 14  --   --   --   ALKPHOS 83  --   --   --   BILITOT <0.1*  --   --   --   ALBUMIN 3.4* 3.1* 3.2* 2.8*   Cardiac Enzymes  Recent Labs Lab 01/20/2015 1453 12/30/14 0730  TROPONINI 0.10* 4.01*   Glucose  Recent Labs Lab 12/30/14 1213 12/30/14 1349 12/30/14 1602 12/30/14 1949 12/31/14 0014 12/31/14 0436  GLUCAP 185* 114* 79 117* 123* 146*    Imaging No results found.   ASSESSMENT / PLAN:  PULMONARY OETT 12/31/2014 in  field A: VDRF in setting of cardiac arrrest P:   VAP bundle  Increased rate to 30 given ongoing acidosis  CARDIOVASCULAR CVL 4/3 A: witnessed cardiac arrest bystander CPR 8 min and then 78min (total 30 min down), on pressors Hx CHF, syncope s/p defibrillator P:  Pt on hypothermia protocol will need to consider rewarming given inability to maintain MAP Pt needed continued epi gtt and neo added overnight to maintain MAP Continue norepi (would escalate this), cont vasopressin to maintain MAP >85 Continue amio to prevent abberrant conduction beats Cards following to eval for multiple PCI vs CABG (appears to have poor targets)  RENAL A:  ESRD on HD, labs stable this AM P:   Renal following Pt on CVVRT   GASTROINTESTINAL A: no acute events P:   Continue PPI  HEMATOLOGIC A:  Stable no acute bleeding P:  Cont to monitor   INFECTIOUS A:  No acute issues P:   Continue to monitor   ENDOCRINE A:  Pt on insulin PTA P:   cbg q4 Sensitive SSI  NEUROLOGIC A:  AMS post cardiac arrest, concern for diffuse anoxic brain injury given downtime and CT findings very poor prognosis P:   RASS goal: -4 for hypothermia CT ruled out other acute causes Neurological assessment once weaned off sedation and warmed   FAMILY  - Updates: no family available yet  - Inter-disciplinary family meet or Palliative Care meeting due by:  4/11 day 7  TODAY'S SUMMARY: 74 yo on hypothermia protocol s/p cardiac arrest with bystander CPR (total estimated time down 17min) cath with multivessel disease. Pt has had continued hypotension and acidosis is being rewarmed. Poor prognosis for recovery.   [name], MD IM PGY-3 Pgr: [phone]  Pulmonary and Critical Care Medicine [facility] Pager: [phone]  12/31/2014, 7:10 AM   Reviewed above, examined.  He has refractory shock and persistent metabolic acidosis with increased lactic acid.  I am concerned he has significant bowel ischemia.   His CT head is concerning for anoxic brain injury and EEG was also consistent with this.  He is comatose, on vent, heart rate irregular, scattered rhonchi, abd distended, chronic changes in limbs.  D/w Dr. Ellyn Hack.  Updated pt's wife at bedside.  Explained to her that he will not likely survive this event.  She does not want to see him suffer, and would not want him to undergo CPR again in the event of cardiac arrest >> DNR order placed.  She wants his children to come in before she makes a decision about transitioning  to comfort measures.  Attempted to contact family relative (Dr. Addison Bailey [phone]) >> connected to voicemail.  CC time by me independent of resident time is 40 minutes.  [name], MD [facility] Pulmonary/Critical Care 12/31/2014, 9:35 AM Pager:  [phone] After 3pm call: [phone]

## 2014-12-31 NOTE — Progress Notes (Signed)
Per ED nurse report, late entry

## 2014-12-31 NOTE — Progress Notes (Addendum)
Switch to normothermia, okay to titrate D10 as needed for blood sugar control between 70-140 Per Dr Halford Chessman

## 2014-12-31 NOTE — Progress Notes (Signed)
CVVHD management.  On 3 pressors now.  Still trying to pull some fluid as CVP 22.  Bicarb low and now on bicarb gtt.  K 4.0.  PO4 and Mg OK.

## 2014-12-31 NOTE — Progress Notes (Signed)
74 year old gentleman with long-standing end-stage renal disease on dialysis history of ICD placement for presumed cardiac arrest or syncope - no documentation of existing ischemic evaluation or catheterization prior to his ICD. He was admitted on April 3 for witnessed cardiac arrest at the [facility].  He had roughly 8 minutes of CPR prior to EMS arrival and was defibrillated within 2 minutes. There were some suggestion of inferior ST elevations and he was referred for cardiac catheterization with results and globe. Last known echo EF of 40-45%, by LV gram is now 35%.  Subjective:  Again, he had significant blood pressure issues overnight requiring restarting his epinephrine drip. Marland Kitchen  Has had significant ectopy but no recurrent VT Remains on amiodarone. This morning upon evaluation by the critical care team he had seizure-like activity  Objective:  Vital Signs in the last 24 hours: Temp:  [91 F (32.8 C)-98.6 F (37 C)] 98.6 F (37 C) (04/05 2000) Pulse Rate:  [46-93] 91 (04/05 2000) Resp:  [15-30] 30 (04/05 2000) BP: (119-157)/(54-60) 157/54 mmHg (04/05 1557) SpO2:  [84 %-99 %] 94 % (04/05 2000) Arterial Line BP: (40-163)/(28-70) 155/53 mmHg (04/05 2000) FiO2 (%):  [80 %-100 %] 80 % (04/05 2000) Weight:  [278 lb 14.1 oz (126.5 kg)] 278 lb 14.1 oz (126.5 kg) (04/05 0402)  Intake/Output from previous day: 04/04 0701 - 04/05 0700 In: 5503.2 [I.V.:5393.2; IV Piggyback:110] Out: 5408  Intake/Output from this shift: Total I/O In: 320.2 [I.V.:320.2] Out: 406 [Other:406]  Physical Exam: General appearance: Intubated and sedated.unresponsive Neck: no adenopathy, no carotid bruit and no JVD Lungs: Coarse tubular breath sounds throughout. Heart: Regular rate and rhythm but with frequent ectopy. No obvious M./R./G. - cannot exclude HSM at apex Abdomen: soft, non-tender; bowel sounds normal; no masses,  no organomegaly Extremities: Bilateral pitting edema with diffuse scaly skin  bilateral lower extremities. Pulses: 1+ distal pulses, cool to palpation Neurologic: Mental status: Intubated and sedated  Lab Results:  Recent Labs  12/30/14 0415 12/31/14 0425  WBC 8.5 13.8*  HGB 12.9* 12.4*  PLT 221 181    Recent Labs  12/31/14 1205 12/31/14 1545  NA 133* 133*  K 4.3 4.2  CL 93* 94*  CO2 17* 19  GLUCOSE 224* 229*  BUN 20 19  CREATININE 4.08* 3.96*    Recent Labs  01/22/2015 1453 12/30/14 0730  TROPONINI 0.10* 4.01*   Hepatic Function Panel  Recent Labs  12/28/2014 1453  12/31/14 1545  PROT 8.4*  --   --   ALBUMIN 3.4*  < > 2.9*  AST 34  --   --   ALT 14  --   --   ALKPHOS 83  --   --   BILITOT <0.1*  --   --   < > = values in this interval not displayed. No results for input(s): CHOL in the last 72 hours. No results for input(s): PROTIME in the last 72 hours.  Imaging: Imaging results have been reviewed  Cardiac Cath: 4/3  Severe multivessel CAD: 30% proximal LAD, 90% ostial D1, 80% mid LAD with distal occlusion at apex. CTL OM1/RI and OM 2 with our ID filled via collaterals. He had a 90% mid RCA with total occlusion of the distal LAD-RPAV  EF~30% global HK  2-D echocardiogram: 4/4  EF 35-40% with global diffuse hypokinesis. Abnormal septal wall motion. Mild to moderately dilated right atrium and right ventricle mildly reduced RV function; trivial pericardial effusion; unable to assess diastolic dysfunction due to atrial  fibrillation.  Telemetry: Mostly paced wide-complex beats with intermittent native wide-complex QRSs as well as fusion beats and PVCs.Rates in the 70s to 90s  Assessment/Plan:  Active Problems:   Cardiac defibrillator in place   Cardiac arrest   NSTEMI (non-ST elevated myocardial infarction)   CAD, multiple vessel: LAD, OM1/RI, OM2, AVG Cx, dRCA   Essential hypertension   ESRD (end stage renal disease)   Chronic systolic heart failure   Respiratory failure   Anoxic brain injury   Multivessel CAD on cardiac  catheterization but with moderate troponin elevation I would not consider this a STEMI. - minimal elevation in the setting of cardiac arrest with a patient who has a known cardiac arrhythmia status post ICD and also likely chronic ischemic cardiopathy. I don't think we can chews 1-2 culprit lesion it was explained to his BP beyond simply chronic ischemic cardiomyopathy with scar related ventricular tachycardia that was unsuccessfully treated by the defibrillator.  Unfortunately he he continued hemodynamic issues requiring high-dose pressors -with Neo-Synephrine now being added. What is seen within a collapsed last night, his blood pressures are relatively stable today on multiple pressors. Concern with prolonged multiple pressors is the potential for ischemic gut. There was a discussion was made reference possible balloon pump insertion. Overall the patient's prognosis is very poor and likely to recover from an anoxic brain injury standpoint. I don't think that mechanical support is indicated at this time.  Would continue with IV amiodarone loading. As long as he is not taking by mouth would continue with IV amiodarone. Would potentially convert to via tube once able. - to avoid significant ectopy would try to wean off of the epinephrine drip  No heart failure symptoms.  Long discussion with the patient's wife myself and the PCCM team -- via telephone calls as were the remaining family members the patient is now been made full DO NOT RESUSCITATE with no escalation of care. We have been asked to turn off treatment mode on the defibrillator. The disc space if there is no sign of recovery the next day or so that the family will likely move towards comfort measures.  We will be available for assistance if needed, however based on the fact that the patient is likely to have a relatively inevitable imminent death, we will simply be available for assistance on an as-needed basis but will sign off for now.    LOS: 2 days    Zahlia Deshazer W 12/31/2014, 8:29 PM

## 2015-01-01 LAB — CBC
HCT: 35.2 % — ABNORMAL LOW (ref 39.0–52.0)
HEMOGLOBIN: 12.2 g/dL — AB (ref 13.0–17.0)
MCH: 30.5 pg (ref 26.0–34.0)
MCHC: 34.7 g/dL (ref 30.0–36.0)
MCV: 88 fL (ref 78.0–100.0)
Platelets: 152 10*3/uL (ref 150–400)
RBC: 4 MIL/uL — ABNORMAL LOW (ref 4.22–5.81)
RDW: 15.1 % (ref 11.5–15.5)
WBC: 10.2 10*3/uL (ref 4.0–10.5)

## 2015-01-01 LAB — RENAL FUNCTION PANEL
Albumin: 2.7 g/dL — ABNORMAL LOW (ref 3.5–5.2)
Anion gap: 16 — ABNORMAL HIGH (ref 5–15)
BUN: 17 mg/dL (ref 6–23)
CALCIUM: 7.2 mg/dL — AB (ref 8.4–10.5)
CO2: 22 mmol/L (ref 19–32)
CREATININE: 3.33 mg/dL — AB (ref 0.50–1.35)
Chloride: 92 mmol/L — ABNORMAL LOW (ref 96–112)
GFR calc non Af Amer: 17 mL/min — ABNORMAL LOW (ref >90)
GFR, EST AFRICAN AMERICAN: 20 mL/min — AB (ref >90)
GLUCOSE: 192 mg/dL — AB (ref 70–99)
Phosphorus: 1.7 mg/dL — ABNORMAL LOW (ref 2.3–4.6)
Potassium: 4.2 mmol/L (ref 3.5–5.1)
SODIUM: 130 mmol/L — AB (ref 135–145)

## 2015-01-01 LAB — GLUCOSE, CAPILLARY
GLUCOSE-CAPILLARY: 208 mg/dL — AB (ref 70–99)
GLUCOSE-CAPILLARY: 171 mg/dL — AB (ref 70–99)
GLUCOSE-CAPILLARY: 193 mg/dL — AB (ref 70–99)
Glucose-Capillary: 187 mg/dL — ABNORMAL HIGH (ref 70–99)
Glucose-Capillary: 180 mg/dL — ABNORMAL HIGH (ref 70–99)

## 2015-01-01 LAB — MAGNESIUM: Magnesium: 1.9 mg/dL (ref 1.5–2.5)

## 2015-01-01 LAB — HEPARIN LEVEL (UNFRACTIONATED): HEPARIN UNFRACTIONATED: 0.25 [IU]/mL — AB (ref 0.30–0.70)

## 2015-01-03 MED FILL — Medication: Qty: 1 | Status: AC

## 2015-01-03 NOTE — Discharge Summary (Signed)
Ryan Campbell was a 74 y.o. admitted on 01/12/2015 syncope from cardiac arrest.  He had prolonged time before ROSC.  He was intubated and started on hypothermia protocol.  He was seen by cardiology.  Renal was consulted for dialysis.  He had his AICD interrogated.  He had cardiac cath done which showed systolic dysfunction with global hypocontractility and diffuse CAD.  CT head showed global cerebral ischemia and EEG showed severe encephalopathy.  Spoke with family about poor prognosis for neurologic recover.  He was made DNR.  Palliative care was consulted.  Family decided to transition to comfort measures.  He was extubated on Jan 22, 2015 and subsequently expired the same day.  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

## 2015-01-07 ENCOUNTER — Telehealth: Payer: Self-pay

## 2015-01-07 NOTE — Telephone Encounter (Signed)
Received [employer]ation death certificate from [employer] 01/07/15.  Will page Dr. Elsworth Soho to see if he will sign as Dr. Halford Chessman is out of office.  Per his request, will leave upstairs-will be in office this afternoon 01/08/15.  Received back and faxed and called for pick-up 01/09/15.

## 2015-01-17 NOTE — Op Note (Signed)
PATIENT NAME:  Ryan Campbell, Ryan Campbell MR#:  [id number] DATE OF BIRTH:  07-17-1941  DATE OF PROCEDURE:  04/30/2013  PREOPERATIVE DIAGNOSES:  1.  End-stage renal disease.  2.  Poor flow, left arm arteriovenous graft.  3.  Cardiac arrhythmias.   POSTOPERATIVE DIAGNOSES: 1.  End-stage renal disease.  2.  Poor flow, left arm arteriovenous graft.  3.  Cardiac arrhythmias.   PROCEDURES:  1.  Ultrasound guidance for vascular access to left arm arteriovenous graft.  2.  Left upper extremity shuntogram and central venogram.  3.  Percutaneous transluminal angioplasty of venous anastomosis with 7 mm diameter angioplasty balloon and 8 mm diameter angioplasty balloon.  4.  Percutaneous transluminal angioplasty of cephalic vein, a cephalic vein and subclavian vein confluence with 7 and 8 mm diameter angioplasty balloon.   SURGEON: Algernon Huxley, M.D.   ANESTHESIA: Local with moderate conscious sedation.   ESTIMATED BLOOD LOSS: Minimal.   FLUOROSCOPY TIME: 2.4 minutes.  CONTRAST USED: 30 mL.   INDICATION FOR PROCEDURE: A 74 year old African American male with end-stage renal disease. He has diminished flow in his left arm AV graft. A non-invasive study has demonstrated stenosis in 2 locations, 1 at the venous anastomosis to the cephalic vein and the other was at the cephalic vein, subclavian vein confluence. He was brought in for treatment of these lesions to salvage this graft.   DESCRIPTION OF PROCEDURE: The patient is brought to the vascular interventional radiology suite. Left upper extremity was sterilely prepped and draped and a sterile surgical field was created. The graft was accessed near the midportion under direct ultrasound guidance with a micropuncture needle. A micropuncture wire and sheath were then placed. Imaging showed an approximately 80% to  85% stenosis at the venous anastomosis and a 75% to 80% stenosis at the cephalic vein in his foot and subclavian vein confluence. His pacer was in  place, but there was good flow the central veins around this and the remainder of the cephalic vein was patent. The patient was heparinized. A Magic torque wire was used to cross both lesions. The more central lesion was treated first. A 7 mm diameter high-pressure angioplasty balloon followed by an 8 mm diameter conventional angioplasty balloon was used and following this there was a less than 20% residual stenosis. The venous anastomosis was then treated again initially with a 7 mm diameter high-pressure angioplasty balloon and then an 8 mm diameter balloon. With the balloon inflated, we were able to opacify the remainder of the graft to the arterial anastomosis, which was widely patent. The remainder of the graft did not have significant stenotic lesion. Following angioplasty of venous anastomosis, there was 20% to 25% residual stenosis that was not flow limiting and I elected to terminate the procedure. The sheath was removed around a 4-0 Monocryl purse suture. Pressure was held and a sterile dressing was placed. The patient tolerated the procedure well and was taken to the recovery room in stable condition.   ____________________________ Algernon Huxley, MD jsd:aw D: 04/30/2013 13:41:55 ET T: 04/30/2013 14:38:26 ET JOB#: [id number]  cc: Algernon Huxley, MD, <Dictator> Algernon Huxley MD ELECTRONICALLY SIGNED 05/03/2013 8:18

## 2015-01-18 NOTE — Discharge Summary (Signed)
PATIENT NAME:  Ryan Campbell, Ryan Campbell MR#:  [id number] DATE OF BIRTH:  April 17, 1941  DATE OF ADMISSION:  03/21/2014 DATE OF DISCHARGE:  03/28/2014  DISCHARGE DIAGNOSIS: Sepsis present on admission, due to pneumonia and possible tick-borne illness, now improved.   SECONDARY DIAGNOSES: 1.  Diabetes mellitus.  2.  Hypertension.  3.  End-stage renal disease on hemodialysis.  4.  Atrial fibrillation.    CONSULTATION: Nephrology, Dr. Glean Salen. Infectious disease, Dr. Adrian Prows.   PROCEDURES AND RADIOLOGY: CT scan of the head without contrast on the 25th of June showed no acute intracranial process. Chest x-ray on the 26th of June showed no acute cardiopulmonary disease.   Left upper extremity Doppler showed no evidence of mass or abscess in the forearm.   CT scan of the chest, abdomen and pelvis on the 1st of July showed no acute findings in the abdomen and pelvis. Ground-glass opacification throughout much of the left lower lobe, compatible with pneumonia. Trace left effusion.   A 2-D echocardiogram on the 30th of June showed LVEF of 35% to 40%. Increased LV internal cavity size. Moderately dilated left atrium. Moderately elevated pulmonary artery systolic pressure. Mild to moderate tricuspid regurgitation. Mild left ventricular hypertrophy.   Major laboratory panel: Blood cultures x2 were negative on admission. UA was negative. Blood cultures repeated on the 28th of June were negative x2. Repeat blood cultures on the 29th of June were negative. [facility] spotted fever IgM antibodies were negative.   HISTORY AND SHORT HOSPITAL COURSE: The patient is a 74 year old male with the above-mentioned medical problems, who was admitted for sepsis. The patient was confused and also had generalized weakness, with fever of 102.8. Please see Dr. Ward Givens dictated history and physical for further details. Nephrology consultation was obtained for dialysis need. Initially, there was no obvious source  of infection found, and the patient continued to spike fever, for which ID consultation was obtained with Dr. Ola Spurr, who recommended CT scan of the chest, abdomen and pelvis to find out obvious source of infection. CT scan showed pneumonia which was likely contributing to patient's fever. With treatment of pneumonia, the patient's fever subsided. He remained afebrile for 48 hours and was discharged home on the 2nd of July in stable condition. There was also concern for possible tick bite related ifnection, for which he was covered with doxycycline. On the date of discharge, his vital signs were as follows: Temperature 97.7, heart rate 62 per minute, respirations 19 per minute, blood pressure 122/74 mmHg. He was saturating 98% on room air.   PERTINENT PHYSICAL EXAMINATION ON DISCHARGE:  CARDIOVASCULAR: S1, S2 normal. No murmurs, rubs, or gallop.  LUNGS: Clear to auscultation bilaterally. No wheezing, rubs, rhonchi, or crepitation.  ABDOMEN: Soft, benign.  NEUROLOGIC: Nonfocal examination.  All other physical examination remained at baseline.   DISCHARGE MEDICATIONS: 1.  Novolin R 2 units injectable once at bedtime.  2.  Centrum Silver once daily.  3.  Renvela 800 mg 2 tablets p.o. with snacks.  4.  Aspirin 81 mg p.o. daily.  5.  Doxycycline 100 mg p.o. b.i.d. for two more days.  6.  Levaquin 500 mg p.o. every 48 hours, 5 total doses.  7.  Renvela 800 mg 4 tablets p.o. 3 times a day with meals.    DISCHARGE DIET: Renal diet.   DISCHARGE ACTIVITY: As tolerated.   DISCHARGE INSTRUCTIONS AND FOLLOW-UP: The patient was instructed to follow up with his primary care physician, Dr. Arna Medici, in 1 to 2 weeks.  He will need follow-up with infectious disease, Dr. Adrian Prows, in 2 to 4 weeks.   TOTAL TIME DISCHARGING THIS PATIENT: 45 minutes.   ____________________________ Lucina Mellow. Manuella Ghazi, MD vss:cg D: 03/29/2014 19:21:14 ET T: 03/30/2014 05:01:58 ET JOB#: [id number]  cc: Kelven Flater S. Manuella Ghazi,  MD, <Dictator> Cristopher Estimable. Mar Daring, MD Cheral Marker. Ola Spurr, MD Murlean Iba, MD Etna Green MD ELECTRONICALLY SIGNED 03/31/2014 18:41

## 2015-01-18 NOTE — Consult Note (Signed)
PATIENT NAME:  Ryan Campbell, Ryan Campbell MR#:  [id number] DATE OF BIRTH:  Apr 28, 1941  DATE OF CONSULTATION:  03/25/2014  REFERRING PHYSICIAN:  Dr. Posey Pronto CONSULTING PHYSICIAN:  Cheral Marker. Ola Spurr, MD  REASON FOR CONSULTATION: Fevers.  HISTORY OF PRESENT ILLNESS: This is a pleasant 74 year old gentleman with history of end-stage renal disease on hemodialysis for 4 to 5 years who was in his usual state of health until he went for dialysis on the 25th. At that time, he was somewhat confused and weak per his wife in the morning. Apparently, he was too weak when he was leaving dialysis so he went and sat outside in the shade, but was brought into the Emergency Room for continued weakness. He was found to have a high fever of 102.8. Since admission he has been treated with broad-spectrum antibiotics and cultures are negative. He has been followed by renal as well.   The patient and his wife reports he went to [place] last weekend for a grandchild's graduation. Because of that he missed his Saturday dialysis and instead came on Monday and Tuesday for dialysis. Those sessions went well. He did report some outdoor activity up in [place] with his family, but does not recall any tick bites. He was outside at the pool with his grandchildren, however. His grandchildren were around him a lot, but none of them were ill at that time. Of note, on June 15th, the patient did have a left upper extremity shuntogram and central venogram and percutaneous transluminal angioplasty of a venous stenosis in his left upper extremity fistula. The patient now tells his wife and she tells me that there was some drainage of "mucus" from his left arm following that and he was applying Bactroban. It is felt it has since improved somewhat. He had also undergone a similar procedure March 11th. He and his wife deny that he has had any other recent dental procedures, colonoscopies or urinary infections or urological procedures. He was in his usual state  of health prior to that.   PAST MEDICAL HISTORY: 1.  End-stage renal disease, on hemodialysis.  2.  Hypertension.  3.  Diabetes.  4.  Atrial fibrillation.   PAST SURGICAL HISTORY: 1.  AV fistula placement.  2.  Permanent pacemaker placement.  3.  Peritoneal dialysis catheter placed but then removed in the past.   ALLERGIES: ACE INHIBITORS.   SOCIAL HISTORY: The patient is married. He moved down here to be with his second wife. He does not smoke, drink or use drugs. He does have children and grandchildren in [place]. No recent sick contacts. No other travel except to [place].   FAMILY HISTORY: Positive for diabetes and hypertension.   REVIEW OF SYSTEMS: Eleven systems reviewed and negative, except as per HPI.   ANTIBIOTICS SINCE ADMISSION: Include vancomycin and Zosyn.   PHYSICAL EXAMINATION:  VITALS: Temperature max 101.6 in the last 24 hours. Prior to that he was 103.1. Pulse is 82, blood pressure 167/89, respirations 16, and sat 95% on room air.  GENERAL: He is well developed and well nourished but he is rigoring somewhat in bed.  HEENT: Pupils are equal, round, and reactive to light and accommodation. Extraocular movements are intact. Sclerae are anicteric. Oropharynx is clear.  NECK: Supple.  HEART: Tachy but regular.  LUNGS: Clear.  ABDOMEN: Soft, nontender, nondistended. No hepatosplenomegaly.  EXTREMITIES: He has 1+ chronic edema in bilateral lower extremity. His feet are cold but are not mottled and there is no evidence of embolic phenomena. His  left upper extremity AV fistula has a good thrill. It is mildly warm. He shows me area where there is a small scab, but there is no purulence from this. He states this is the area that was draining mucus.  NEUROLOGIC: He is alert and oriented x3. Grossly nonfocal neuro exam.   DIAGNOSTIC DATA: Blood cultures done June 28th x2 are no growth to date, from June 25th there is no growth to date. White blood count on admission was 8.2, currently  7.5. Hemoglobin 13.3 and platelets 107,000. Renal function is consistent with end-stage renal disease. CPK is elevated at 1195. Urinalysis shows only 7 white cells.   Imaging: CT of the head is negative. Chest x-ray is nondiagnostic. No evidence of cardiopulmonary disease.   IMPRESSION: A 74 year old gentleman on dialysis who was in his usual state of health until 4 days ago when he was at dialysis and felt quite weak. He has recently traveled to [place] to spend time with his grandkids and he recently had AV fistula accessed. So far white count is normal and blood cultures are negative, but his fevers persist despite vancomycin and Zosyn. He reports some mucus draining from the left fistula site, but he recently had a procedure done.   I suspect either he has a left upper extremity fistula infection or a tickborne illness with recent travel and some outdoor activity with his grandkids. A viral illness could also be a possibility as he was around children this past weekend. Prior to that he was quite well. He has no swollen or inflamed joints that would suggest septic arthritis. He has no abdominal complaints to suggest an abdominal source.   RECOMMENDATIONS: 1.  I have added doxycycline to his regimen, 100 twice a day, IV.  2.  We will check liver function tests to ensure there is no abnormalities there. He does have an elevated CPK and some thrombocytopenia.  3.  Will send [facility] Spotted Fever serologies.  4.  I have placed an order for an ultrasound of his left upper extremity.  5.  Likely need to get an echocardiogram, but I would see how he does over the next day or so.   Thank you for the consult. I would be glad to follow with you.   ____________________________ Cheral Marker. Ola Spurr, MD dpf:sb D: 03/25/2014 15:08:18 ET T: 03/25/2014 16:00:36 ET JOB#: [id number]  cc: Cheral Marker. Ola Spurr, MD, <Dictator> DAVID Ola Spurr MD ELECTRONICALLY SIGNED 04/15/2014 21:12

## 2015-01-18 NOTE — H&P (Signed)
PATIENT NAME:  Ryan Campbell, Ryan Campbell MR#:  [id number] DATE OF BIRTH:  1941-06-03  DATE OF ADMISSION:  03/21/2014  PRIMARY CARE PHYSICIAN: Dr. Daneen Schick   REFERRING PHYSICIAN: Sherrie George, nurse practitioner in Emergency Department.   CHIEF COMPLAINT:  Confusion, generalized weakness.   HISTORY OF PRESENT ILLNESS: This patient is a 74 year old pleasant African American male with past medical history of end-stage renal disease on hemodialysis Tuesday, Thursday, Saturday schedule through left arm AV fistula. Completed his dialysis. The patient states felt cold while in the dialysis unit. Concerning this, walked out and was sitting in the lobby. The patient started feeling generalized weakness. Concerning this, decided to walk and sit in the car. As the patient was walked some distance, could not walk further.  Started to experience severe generalized weakness and was sitting at the curb.  Patient denies any confusion at any given time.  However, the patient's wife states that the patient was found confused. Concerning this, the patient was brought to the Emergency Department.   WORKUP IN THE EMERGENCY DEPARTMENT: The patient was found to have a fever of 102.8. Per patient's wife, patient has been lethargic with increased somnolence in the last 2-3 days. Had somewhat decreased appetite. The patient does not have any elevated white blood cell count; however, has left shift of 81%. The patient recently had intervention done to the AV fistula. Denies having any pain.   Marland Kitchen   PAST MEDICAL HISTORY:  1. Diabetes mellitus.  2. Hypertension.  3. End-stage renal disease on hemodialysis.   4. Atrial fibrillation.   PAST SURGICAL HISTORY:  1. Peritoneal dialysis catheter placed in the past.  2. AV fistulas.  3. Pacemaker placement.   ALLERGIES: ACE INHIBITORS CAUSE COUGH.   HOME MEDICATIONS:  1. Renvela 800 mg 4 tablets 3 times a day.  2. Renvela 2 tablets with snacks.  3. Novolin R 2 units with  meal. 4. Centrum Silver daily.  5. Aspirin 81 mg daily.   SOCIAL HISTORY: No history of smoking, drinking alcohol or using illicit drugs.  Married, lives with his wife.   FAMILY HISTORY: Diabetes mellitus and hypertension.   REVIEW OF SYSTEMS:  CONSTITUTIONAL: Experiencing generalized weakness.  EYES: No change in vision.  EARS, NOSE, AND THROAT:  No change in hearing.  RESPIRATORY: Has cough, mild productive sputum. No shortness of breath.  GASTROINTESTINAL: Has decreased appetite. No nausea or vomiting.  GENITOURINARY: No dysuria. Urinates 3-4 times a day.  HEMATOLOGIC: No easy bruising or bleeding.  ENDOCRINE: No polyuria or polydipsia. Has diagnosis of diabetes mellitus.  SKIN: No rash or lesions.  MUSCULOSKELETAL: No joint pains and aches.  NEUROLOGIC: No weakness or numbness in any part of the body.   PHYSICAL EXAMINATION:  GENERAL: This is a well built, well nourished, age-appropriate male lying down in the bed, not in distress.  VITAL SIGNS: Temperature at the time of the presentation was 102.8, pulse 66, blood pressure 113/66, respiratory rate of 18, oxygen saturation 97% on 2 liters of oxygen.  HEENT: Head normocephalic, atraumatic. There is no scleral icterus, conjunctivae normal. Pupils equal and react to light. Extraocular movements are intact. Mucous membranes moist. No pharyngeal erythema.  NECK: Supple. No lymphadenopathy. No JVD. No carotid bruit.  CHEST: Has no focal tenderness.  Has crackles heard in the left lower lobe.  HEART: S1, S2 regular. No murmurs are heard.  ABDOMEN: Bowel sounds present. Soft, nontender, nondistended. No hepatosplenomegaly.  EXTREMITIES: No pedal edema. Pulses 2+.  SKIN: No rash or  lesions.  MUSCULOSKELETAL: Good range of motion in all the extremities.  NEUROLOGIC: The patient is alert, oriented to place, person, and time. Cranial nerves II through XII intact. Motor 5/5 in upper and lower extremities. ABDOMEN:  No hepatosplenomegaly.    LABORATORY DATA: Complete metabolic panel: BUN 22, creatinine of 4.65.  Rest of all the values are within normal limits. CBC: WBC of 8.2, hemoglobin 12.7, platelet count of 118,000.   Troponin 0.08.  IMAGING DATA:  CT head without contrast: No acute intracranial abnormality.   ASSESSMENT AND PLAN: This patient is a 74 year old male who is found to be septic from unknown source at this time.  1. Sepsis. The patient has fever of 102.8, confusion.  The differential diagnosis, the patient has left lower lobe crackles. The possibility of infection, as the patient has been having cough. We will also check the urinalysis as well as blood cultures have been drawn in the Emergency Department as patient had recent intervention done to the dialysis fistula. Continue with the vancomycin and Zosyn until cultures are back.  2. Hypertension, currently well-controlled. Not on any medications.  3. Diabetes mellitus on regular insulin on sliding scale insulin.  4. End-stage renal disease, on hemodialysis. Does not seem to be any fluid overloaded. The patient just had a dialysis done today.  5. Keep the patient on deep vein thrombosis prophylaxis with heparin.   TIME SPENT: 55 minutes.    ____________________________ Monica Becton, MD pv:dd D: 03/22/2014 00:46:24 ET T: 03/22/2014 03:15:27 ET JOB#: [id number]  cc: Monica Becton, MD, <Dictator> Belva Crome, III MD  Monica Becton MD ELECTRONICALLY SIGNED 03/24/2014 1:32

## 2015-01-18 NOTE — Op Note (Signed)
PATIENT NAME:  CANDEN, CIESLINSKI MR#:  [id number] DATE OF BIRTH:  Jan 22, 1941  DATE OF PROCEDURE:  03/11/2014  PREOPERATIVE DIAGNOSES:  1.  End-stage renal disease.  2.  Poorly functioning left arm arteriovenous graft.  3.  Hypertension.   POSTOPERATIVE DIAGNOSES: 1.  End-stage renal disease.  2.  Poorly functioning left arm arteriovenous graft.  3.  Hypertension.   PROCEDURE:  1.  Ultrasound guidance for vascular access to left arm arteriovenous graft.  2.  Left upper extremity shuntogram and central venogram.  3.  Percutaneous transluminal angioplasty of venous anastomosis with 8 mm diameter angioplasty balloon.  4.  Percutaneous transluminal angioplasty of cephalic vein/subclavian vein confluence with 8 mm diameter angioplasty balloon.   SURGEON: Leotis Pain, M.D.   ANESTHESIA: Local with moderate conscious sedation.   ESTIMATED BLOOD LOSS: Minimal.   INDICATION FOR PROCEDURE: A 74 year old male with end-stage renal disease. His dialysis access flows are poor, and he has got 2 areas of stenosis seen on noninvasive studies. He is brought in for angiography for further evaluation and potential treatment. Risks and benefits were discussed. Informed consent was obtained.   DESCRIPTION OF PROCEDURE: The patient is brought to the vascular suite. The left upper extremity was sterilely prepped and draped and a sterile surgical field was created. The graft was accessed near its apex in an antegrade fashion under direct ultrasound guidance without difficulty with a micropuncture needle. A micropuncture wire and sheath were then placed and upsized to a 6-French sheath and gave 3000 units of intravenous heparin. Imaging was performed. There was about an 80% stenosis the cephalic vein and subclavian vein confluence within the cephalic vein. The venous anastomosis appeared to have a 50% to 55% stenosis that was not as tight, but did appear to still be narrowed across both lesions. I treated both lesions  separately and distinctly with an 8 mm diameter x 6 cm length angioplasty balloon. Narrowings were seen with angioplasty and this resolved with inflation of the balloon. Completion angiogram showed significantly improved flow through the graft with about a 20% residual stenosis and at the cephalic vein and subclavian vein confluence, there was an excellent angiographic result as well with about 20% to 25% residual stenosis. At this point, I elected to terminate the procedure. The sheath was removed. Monocryl 4-0 pursestring sutures were placed. Pressure was held. Sterile dressing was placed. The patient tolerated the procedure well and was taken to the recovery room in stable condition.   ____________________________ Algernon Huxley, MD jsd:aw D: 03/11/2014 12:44:58 ET T: 03/11/2014 13:43:07 ET JOB#: [id number]  cc: Algernon Huxley, MD, <Dictator> Algernon Huxley MD ELECTRONICALLY SIGNED 03/14/2014 10:54

## 2015-01-18 NOTE — Op Note (Signed)
PATIENT NAME:  HAWTHORNE, DAY MR#:  [id number] DATE OF BIRTH:  Feb 10, 1941  DATE OF PROCEDURE:  12/05/2013  PREOPERATIVE DIAGNOSES: 1.  Complication arteriovenous dialysis device.  2.  End-stage renal disease requiring hemodialysis.  3.  Hypertension.  POSTOPERATIVE DIAGNOSES: 1.  Complication arteriovenous dialysis device.  2.  End-stage renal disease requiring hemodialysis.  3.  Hypertension.  PROCEDURES PERFORMED: 1.  Contrast injection, left forearm arteriovenous loop graft.  2.  Percutaneous transluminal angioplasty to 8 mm venous anastomosis, left forearm loop graft.   SURGEON: Hortencia Pilar, MD.   SEDATION: Versed 3 mg plus fentanyl 100 mcg administered IV. Continuous ECG, pulse oximetry and cardiopulmonary monitoring is performed throughout the entire procedure by the interventional radiology nurse.  TOTAL SEDATION TIME:  30 minutes.   ACCESS: A 6-French sheath, antegrade direction, left forearm loop graft.   CONTRAST USED:  Isovue 20 mL.   FLUOROSCOPY TIME: 1.2 minutes.   INDICATIONS FOR PROCEDURE: Mr. Yom is a 74 year old gentleman who presents with worsening dialysis parameters. His transonic flow reading is now down to 600, a prethrombotic level, and he has, therefore, been referred back to the dialysis center for evaluation of his AV graft. The risks and benefits of imaging and possible intervention are reviewed. The patient agrees to proceed.   DESCRIPTION OF PROCEDURE: The patient is taken to special procedures and placed in the supine position. After adequate sedation is achieved, he is positioned supine and his left arm is extended palm upward. Left arm is prepped and draped in sterile fashion. Appropriate timeout is called.   Lidocaine 1% is infiltrated in the soft tissues overlying the AV graft, and in an antegrade direction a micropuncture needle is used to access the graft, microwire followed by microsheath, J-wire followed by a 6-French sheath.   Hand  injection of contrast demonstrates the graft appears to be in very good condition. No evidence of hemodynamically significant stenoses throughout its course; however, at the venous anastomosis, there is a high-grade lesion greater than 70% which fills a cephalic vein that is actually quite large and dilated measuring 12 mm to 14 mm in diameter.   Heparin 3000 units is given and a Magic torque wire is advanced across the lesion. Initially, a 6 x 4 Lutonix balloon is used to angioplasty the venous anastomosis and subsequently an 8 x 6 balloon. Pressures are to 14 atmospheres and inflation times are 2 minutes.   Followup angiography demonstrates an excellent result. There is now no residual stenosis when the area is compared to the AV graft and rapid filling of contrast. Reflux imaging with manual compression demonstrates the arterial portion is widely patent, as is the visualized portion of brachial artery.   The wire is removed. Pursestring suture of 4-0 Monocryl was placed around the sheath. Sheath is removed, sutures secured and light pressure is held. There are no immediate complications.   INTERPRETATION: The arteriovenous graft is in fairly good condition. There are no hemodynamically significant stenoses. At the venous anastomosis, there is a high-grade lesion and then the cephalic vein is widely patent and actually nicely dilated throughout its course. Following angioplasty first to 6 mm and then 8 mm, there is resolution of this lesion.   SUMMARY: Successful salvage of left forearm AV loop graft.    ____________________________ Katha Cabal, MD ggs:ce D: 12/05/2013 15:33:10 ET T: 12/05/2013 16:06:29 ET JOB#: [id number]  cc: Katha Cabal, MD, <Dictator> Dr. Octaviano Glow, III MD  Katha Cabal MD ELECTRONICALLY  SIGNED 12/18/2013 18:46

## 2015-01-20 ENCOUNTER — Ambulatory Visit (INDEPENDENT_AMBULATORY_CARE_PROVIDER_SITE_OTHER): Payer: Federal, State, Local not specified - PPO | Admitting: Ophthalmology

## 2015-01-26 NOTE — Procedures (Signed)
Extubation Procedure Note  Patient Details:   Name: NATURE VOGELSANG DOB: 01-25-1941 MRN: [id number]   Airway Documentation:     Evaluation Terminal Extubation per MD Complications:no complications Patient did tolerate procedure well. Bilateral Breath Sounds: Diminished Suctioning: Airway  Terminally extubated pt to RA per MD order.  Constance Holster Jan 27, 2015, 2:47 PM

## 2015-01-26 NOTE — Progress Notes (Signed)
ANTICOAGULATION CONSULT NOTE - Follow Up Consult Pharmacy Consult for heparin Indication: chest pain/ACS  Allergies  Allergen Reactions  . Statins Cough    Patient Measurements: Height: 6\' 4"  (193 cm) Weight: 279 lb 15.8 oz (127 kg) IBW/kg (Calculated) : 86.8 Heparin Dosing Weight: 114kg  Vital Signs: Temp: 98.8 F (37.1 C) (04/06 0600) Temp Source: Core (Comment) (04/06 0400) BP: 148/55 mmHg (04/06 0310) Pulse Rate: 87 (04/06 0600)  Labs:  Recent Labs  01/21/2015 1453  01/20/2015 1820  12/30/14 12/30/14 0019 12/30/14 0415 12/30/14 0730  12/31/14 0425  12/31/14 0756 12/31/14 1205 12/31/14 1545 01-08-2015 0426 01/08/2015 0427  HGB 12.1*  < >  --   < > 15.0  --  12.9*  --   --  12.4*  --   --   --   --   --   --   HCT 36.6*  < >  --   < > 44.0  --  38.7*  --   --  39.9  --   --   --   --   --   --   PLT 221  --   --   --   --   --  221  --   --  181  --   --   --   --   --   --   APTT  --   --  43*  --   --  46*  --   --   --   --   --   --   --   --   --   --   LABPROT  --   --  19.2*  --   --  21.6*  --   --   --   --   --   --   --   --   --   --   INR  --   --  1.60*  --   --  1.86*  --   --   --   --   --   --   --   --   --   --   HEPARINUNFRC  --   --   --   --   --   --   --   --   < >  --   < > 0.49 0.40  --   --  0.25*  CREATININE 6.54*  < >  --   < > 5.90*  --  5.79*  5.85* 5.50*  < >  --   --  4.31* 4.08* 3.96* 3.33*  --   TROPONINI 0.10*  --   --   --   --   --   --  4.01*  --   --   --   --   --   --   --   --   < > = values in this interval not displayed.  Estimated Creatinine Clearance: 29.6 mL/min (by C-G formula based on Cr of 3.33).  Assessment: 74yo male with cardiac arrest/STEMI s/p hypothermia protocol, for heparin.   Goal of Therapy:  Heparin level 0.3-0.7 units/ml Monitor platelets by anticoagulation protocol: Yes   Plan:  Increase Heparin 1850 units/hr  Phillis Knack, PharmD, BCPS

## 2015-01-26 NOTE — Progress Notes (Signed)
Spoke with [facility] examiner. Pt is not an ME case.

## 2015-01-26 NOTE — Progress Notes (Addendum)
Palliative team and CCM team came to bedside and spoke extensively with the family. Family decision to withdrawal care was made. MD placed orders to terminally extubate and discontinue any pressor support and CRRT. Respiratory extubated pt and all pressors were turned off CRRT stopped. Family at bedside. Pt asystole at 1451. 2 RNs Newman Nickels RN and Balinda Quails RN) auscultated heart and lunsg sounds for 2 mins. No heart and lung sounds auscultated. Notified [name] MD of pt passing MD who was on unit. Also notified eLink MD. Family at bedside emotional support given chaplain offered no needs at this time.

## 2015-01-26 NOTE — Progress Notes (Signed)
Dr Halford Chessman made changes with vent settings-RT found pt on these current vent settings

## 2015-01-26 NOTE — Progress Notes (Addendum)
Wasted 100 mL of Fentanyl and 45 mL of Versed with Balinda Quails RN

## 2015-01-26 NOTE — Consult Note (Signed)
Jan 12, 2015  Consult to transition to comfort care/compassionate de-escalation of care.  Discussed current situation with primary RN and Dr. Algis Liming.  Mr. Sagan has exceeded the limits of vent, pressor, HD support and continues to have decline in vital signs and metabolic sufficiency.    Family dynamics include a wife of 2 years and 3 adult children.  Mrs. Elsayed is at bedside as well as and adult daughter, son, daughter in law, niece and nephew.    Information regarding Mr. Buskey's grave condition was given including his decline despite efforts to maintain critical functioning.  It appears that all of the family members were aware of the likely outcome, they really needed consensus and emotional support to be able to verbalize their understanding and final consent to compassionate weaning of interventions.    They gathered at the bedside and started reviewing the life of Mr. Intriago, sharing stories and supporting one another as the process took place.    The decision was made by Mrs. Lieurance with agreement of the children at bedside to provide compassionate weaning of interventions and allow for a natural death for Mr. Fredericksen.   Family dynamics are supportive and I encouraged them to continue to support each other and Mr. Eichorn as the process takes place.  Primary RN and Critical care team, Dr. Algis Liming notified of family decision.  Will support however necessary if needed.  Family declined chaplain support at this time.  Please call if needed.  Kizzie Fantasia, RN, MSN, [facility]

## 2015-01-26 NOTE — Progress Notes (Signed)
PULMONARY / CRITICAL CARE MEDICINE   Name: Ryan Campbell MRN: [id number] DOB: 20-Mar-1941    ADMISSION DATE:  01/09/2015  CHIEF COMPLAINT:  Cardiac arrest  INITIAL PRESENTATION: 74 y M pmh systolic CHF 46-65%, hx of syncope and cardiac arrest back in 2011 with fam hx of syncope unknown coronary anatomy previous to this arrest, ESRD on HD, HTN presnted after witness cardiac arrest 2 with bystander CPR x8 min and then EMS additional 20 min cPR until ROSC. Pt cathed then cooled.   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  Rewarming.  VITAL SIGNS: Temp:  [96.8 F (36 C)-98.8 F (37.1 C)] 98.8 F (37.1 C) (04/06 0600) Pulse Rate:  [81-93] 88 (04/06 0745) Resp:  [27-30] 30 (04/06 0745) BP: (133-157)/(51-55) 133/51 mmHg (04/06 0745) SpO2:  [91 %-100 %] 100 % (04/06 0745) Arterial Line BP: (117-164)/(49-57) 139/52 mmHg (04/06 0700) FiO2 (%):  [80 %] 80 % (04/06 0745) Weight:  [279 lb 15.8 oz (127 kg)] 279 lb 15.8 oz (127 kg) (04/06 0340) HEMODYNAMICS: CVP:  [17 mmHg-21 mmHg] 17 mmHg VENTILATOR SETTINGS: Vent Mode:  [-] PRVC FiO2 (%):  [80 %] 80 % Set Rate:  [30 bmp] 30 bmp Vt Set:  [550 mL] 550 mL PEEP:  [8 cmH20] 8 cmH20 Plateau Pressure:  [26 cmH20-30 cmH20] 28 cmH20 INTAKE / OUTPUT:  Intake/Output Summary (Last 24 hours) at Jan 29, 2015 0801 Last data filed at 01-29-15 0700  Gross per 24 hour  Intake 7128.73 ml  Output   8962 ml  Net -1833.27 ml    PHYSICAL EXAMINATION: General: resting in bed, sedated Cardiac: RRR, no rubs, murmurs or gallops Pulm: clear to auscultation  bilaterally, no crackles or wheezes, moving normal volumes of air Abd: soft, nondistended, BS present Ext: cool, no pedal edema, no areas of necrosis noted, very weak palpable pulses Neuro: sedated  LABS:  CBC  Recent Labs Lab 01/18/2015 1453  12/30/14 12/30/14 0415 12/31/14 0425  WBC 6.5  --   --  8.5 13.8*  HGB 12.1*  < > 15.0 12.9* 12.4*  HCT 36.6*  < > 44.0 38.7* 39.9  PLT 221  --   --  221 181  < > = values in this interval not displayed. Coag's  Recent Labs Lab 01/18/2015 1820 12/30/14 0019  APTT 43* 46*  INR 1.60* 1.86*   BMET  Recent Labs Lab 12/31/14 1205 12/31/14 1545 Jan 29, 2015 0426  NA 133* 133* 130*  K 4.3 4.2 4.2  CL 93* 94* 92*  CO2 17* 19 22  BUN 20 19 17   CREATININE 4.08* 3.96* 3.33*  GLUCOSE 224* 229* 192*   Electrolytes  Recent Labs Lab 12/30/14 0415  12/31/14 0420  12/31/14 1205 12/31/14 1545 Jan 29, 2015 0426  CALCIUM 8.4  8.4  < > 7.6*  7.7*  < > 7.6* 7.6* 7.2*  MG 2.0  --  2.1  --   --   --  1.9  PHOS 6.0*  < > 6.7*  --   --  2.9 1.7*  < > = values in this interval not displayed. Sepsis Markers  Recent Labs Lab 01/05/2015 1503 12/30/14 1948 12/30/14 2002  LATICACIDVEN  5.53* 8.94* 8.90*   ABG  Recent Labs Lab 12/30/14 0545 12/30/14 2019 12/31/14 0058  PHART 7.303* 7.163* 7.297*  PCO2ART 37.3 40.5 27.0*  PO2ART 72.0* 65.0* 55.0*   Liver Enzymes  Recent Labs Lab 01/18/2015 1453  12/31/14 0420 12/31/14 1545 22-Jan-2015 0426  AST 34  --   --   --   --   ALT 14  --   --   --   --   ALKPHOS 83  --   --   --   --   BILITOT <0.1*  --   --   --   --   ALBUMIN 3.4*  < > 2.8* 2.9* 2.7*  < > = values in this interval not displayed. Cardiac Enzymes  Recent Labs Lab 01/25/2015 1453 12/30/14 0730  TROPONINI 0.10* 4.01*   Glucose  Recent Labs Lab 12/31/14 0756 12/31/14 1151 12/31/14 1548 12/31/14 1958 12/31/14 2342 01-22-15 0402  GLUCAP 185* 207* 223* 208* 187* 171*    Imaging No results found.   ASSESSMENT /  PLAN:  PULMONARY OETT 01/10/2015 in field A: VDRF in setting of cardiac arrrest P:   VAP bundle  Increased rate to 30 given ongoing acidosis  CARDIOVASCULAR CVL 4/3 A: witnessed cardiac arrest bystander CPR 8 min and then 53min (total 30 min down), on pressors Hx CHF, syncope s/p defibrillator P:  Pt warmed on 4/5  Pt needed continues on epi gtt, vasopressin, and neo added overnight to maintain MAP Continue amio to prevent abberrant conduction beats Per family mtg discussion DNR and ICD defibrillator turned off  RENAL A:  ESRD on HD,pt continues to have AG metabolic acidosis,  P:   Renal following Pt on bicarb gtt Pt on CVVRT   GASTROINTESTINAL A: no acute events P:   Continue PPI  HEMATOLOGIC A:  Stable no acute bleeding P:  Cont to monitor   INFECTIOUS A:  No acute issues P:   Continue to monitor   ENDOCRINE A:  Pt on insulin PTA P:   cbg q4 Sensitive SSI  NEUROLOGIC A:  AMS post cardiac arrest, concern for diffuse anoxic brain injury given downtime and CT findings very poor prognosis P:   Pt with seizures in setting of being rewarmed increased versed CT ruled out other acute causes   FAMILY  - Updates: 12/31/14 family updated on pt status and goals of care - Inter-disciplinary family meet or Palliative Care meeting due by:  4/11 day 7  TODAY'S SUMMARY: 74 yo on hypothermia protocol s/p cardiac arrest with bystander CPR (total estimated time down 25min) cath with multivessel disease. Pt continues to require maximal medical support without improvement. Family meeting on 12/31/14 DNR. Awaiting palliative care consult.   [name], MD IM PGY-3 Pgr: [phone]  Pulmonary and [place] Pager: [phone]  Jan 22, 2015, 8:01 AM  Reviewed above, examined.  Pt noted to have seizure activity when sedation decreased.  Remained dependent on vent, pressors, CRRT.  Appreciate help from palliative care team.  Family agreed to proceed  with comfort measures, and patient extubated.  [name], MD [facility] Pulmonary/Critical Care 2015/01/22, 2:54 PM Pager:  [phone] After 3pm call: [phone]

## 2015-01-26 NOTE — Progress Notes (Signed)
CVVHD Management.  On bicarb gtt.  PO4 sl low but otherwise metabolically stable.  His prognosis is poor and awaiting palliative care to see but I think we should move to comfort care.  Will not replace PO4.  Anticipate DCing CVVHD after palliative care consult

## 2015-01-26 DEATH — deceased

## 2015-03-17 ENCOUNTER — Encounter: Payer: Federal, State, Local not specified - PPO | Admitting: Internal Medicine

## 2015-08-06 IMAGING — CT CT HEAD WITHOUT CONTRAST
2 series · 16 of 30 positions shown, 20 images · non-contrast
Comparison: None.

CLINICAL DATA: Amnesia

EXAM:
CT HEAD WITHOUT CONTRAST
TECHNIQUE: Contiguous axial images were obtained from the base of the skull
through the vertex without intravenous contrast.

[Series 2: head wo · axial · 0.45mm/px · z∈[+197,+328]mm · 13 of 35 slices shown, 17 images]
[im 3/35  brain]
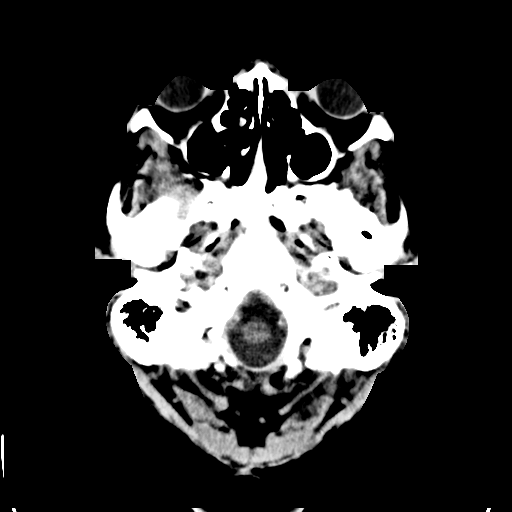
[im 3/35  bone]
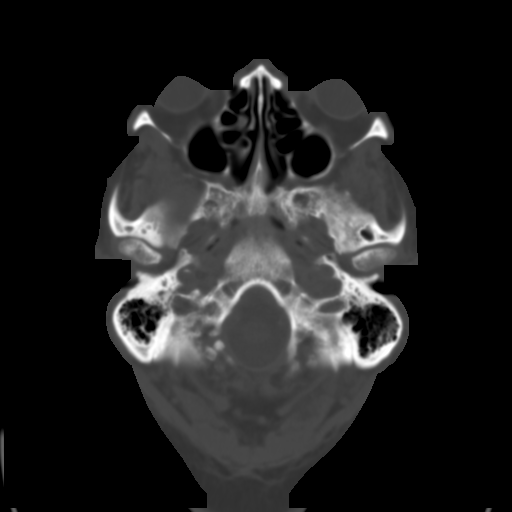
[im 5/35  brain]
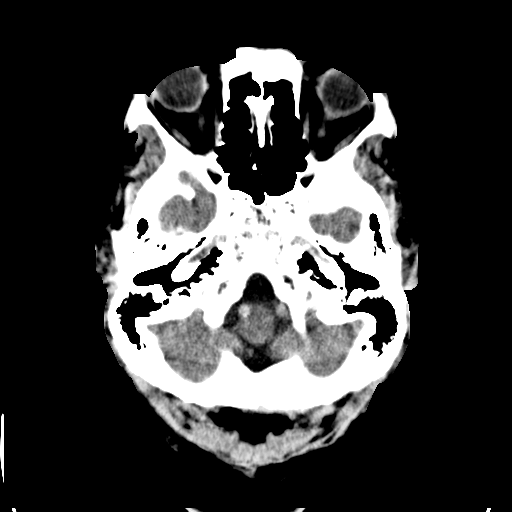
[im 8/35  brain]
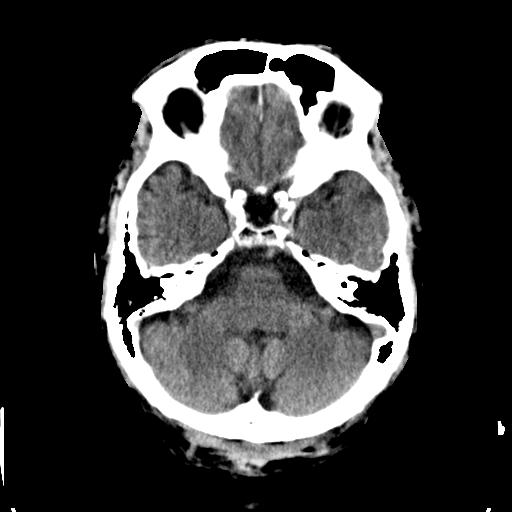
[im 10/35  brain]
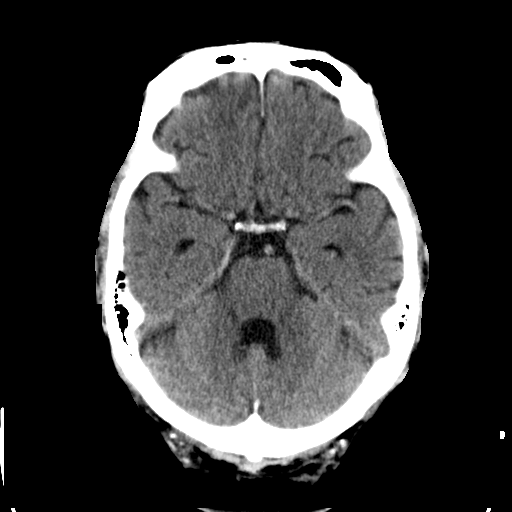
[im 13/35  brain]
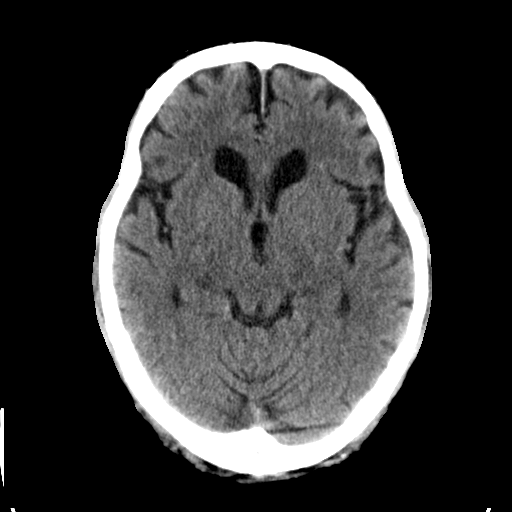
[im 13/35  bone]
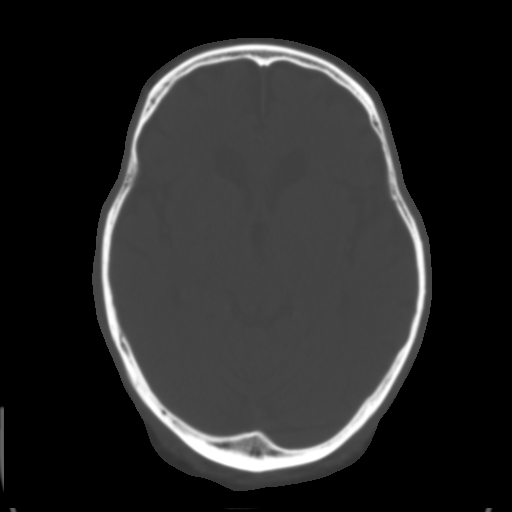
[im 15/35  brain]
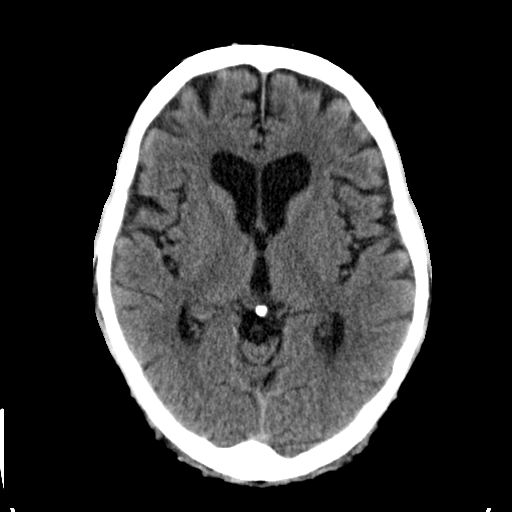
[im 18/35  brain]
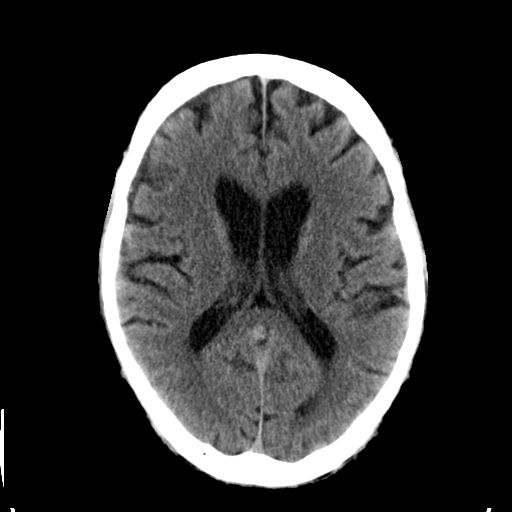
[im 20/35  brain]
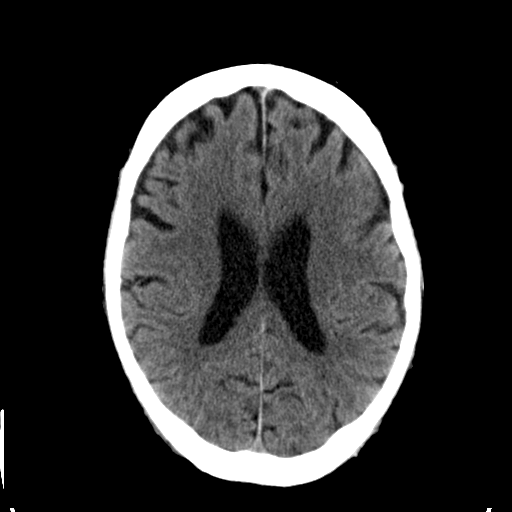
[im 22/35  brain]
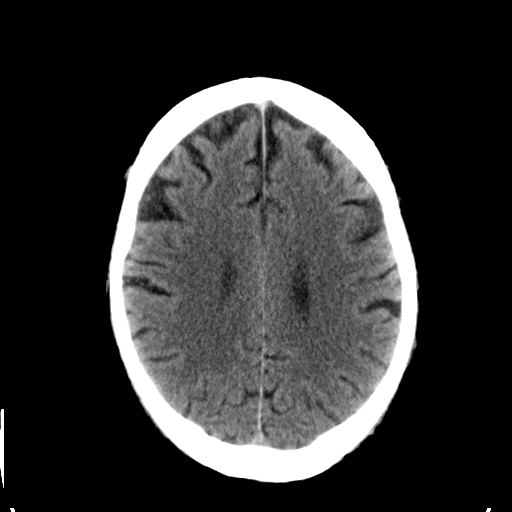
[im 22/35  bone]
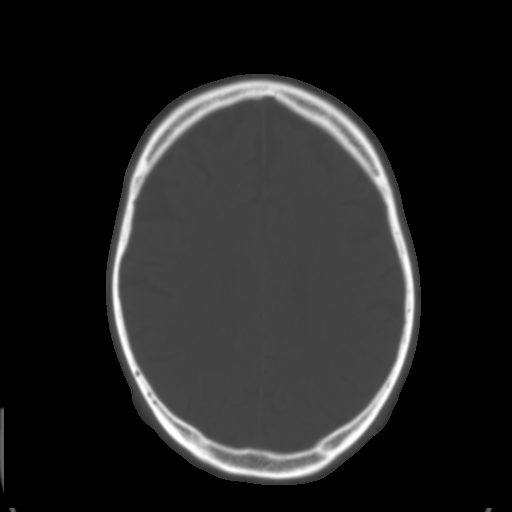
[im 25/35  brain]
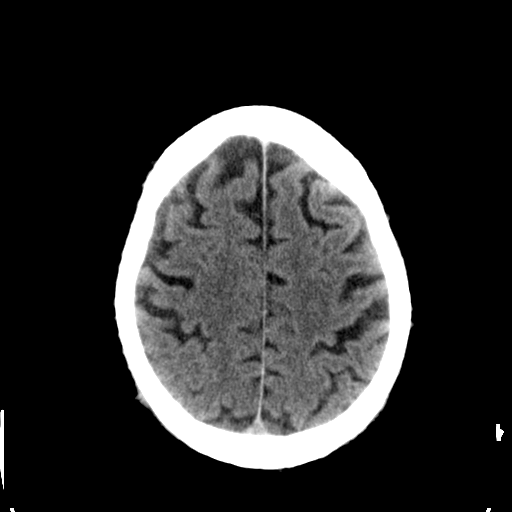
[im 27/35  brain]
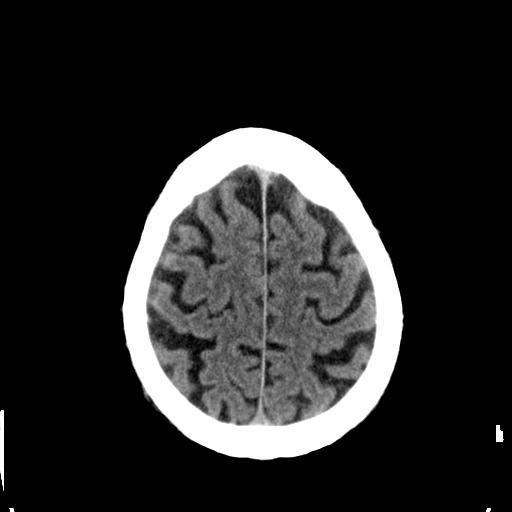
[im 30/35  brain]
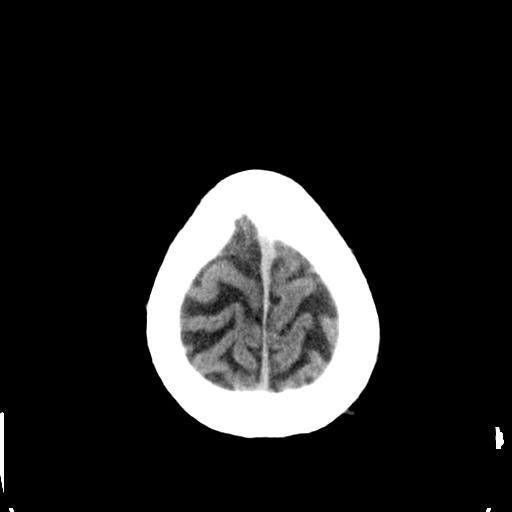
[im 32/35  brain]
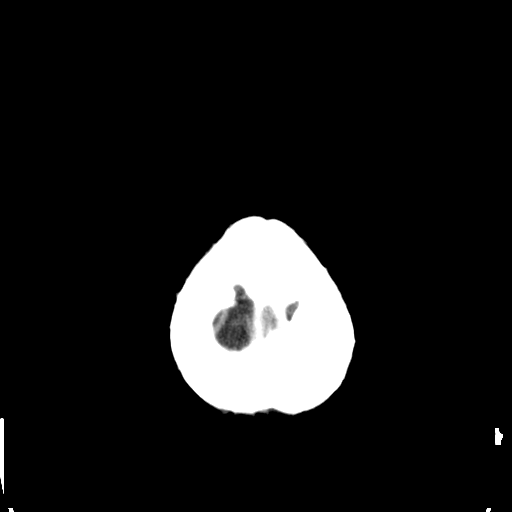
[im 32/35  bone]
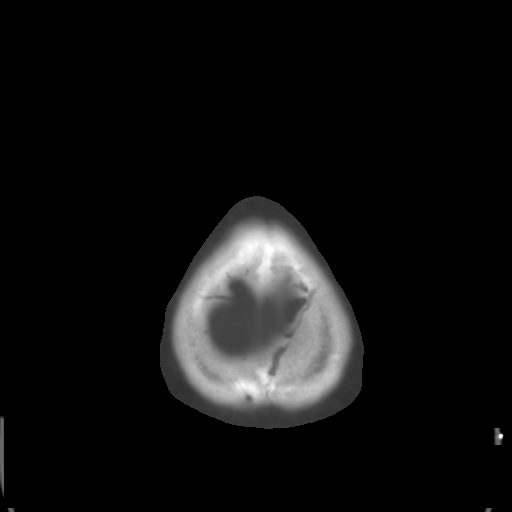

[Series 3: head bone · axial · 0.45mm/px · z∈[+197,+242]mm · 3 of 36 slices shown]
[im 3/36  bone]
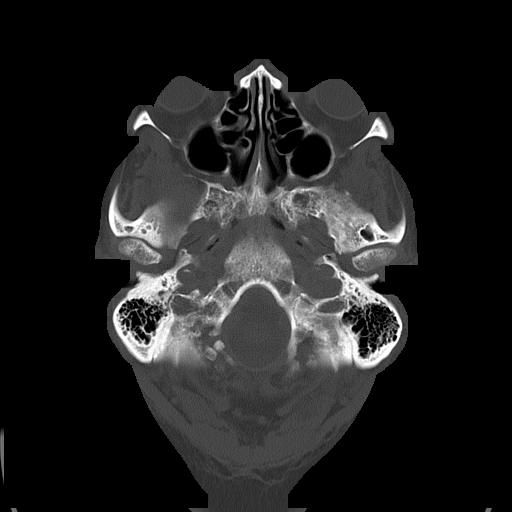
[im 8/36  bone]
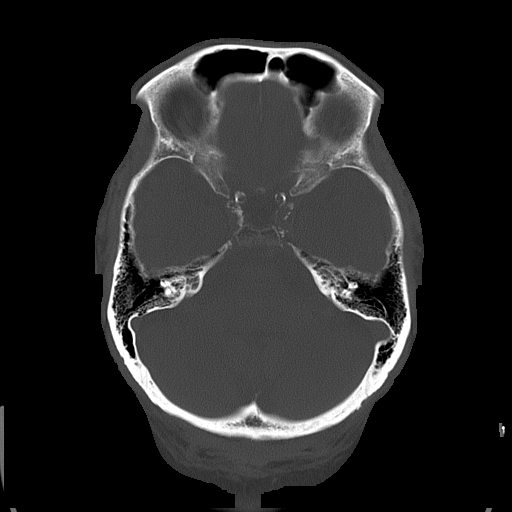
[im 13/36  bone]
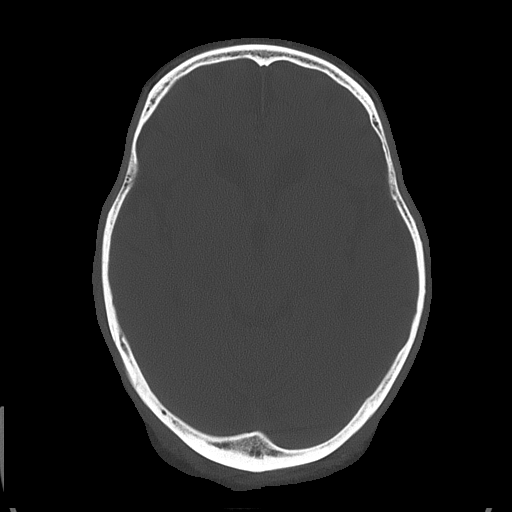

[16 of 30 positions shown; findings below may reference images not displayed]

FINDINGS: There is no evidence of mass effect, midline shift, or extra-axial
fluid collections. There is no evidence of a space-occupying lesion
or intracranial hemorrhage. There is no evidence of a cortical-based
area of acute infarction. There is generalized cerebral atrophy.

The ventricles and sulci are appropriate for the patient's age. The
basal cisterns are patent.

Visualized portions of the orbits are unremarkable. The visualized
portions of the paranasal sinuses and mastoid air cells are
unremarkable. Cerebrovascular atherosclerotic calcifications are
noted.

The osseous structures are unremarkable.
IMPRESSION: No acute intracranial pathology.

## 2016-05-15 IMAGING — CR DG CHEST 1V PORT
1 series · 1 of 1 positions shown · non-contrast
Comparison: 12/29/2014

CLINICAL DATA: Respiratory failure, line placement

EXAM:
PORTABLE CHEST - 1 VIEW

[AP]
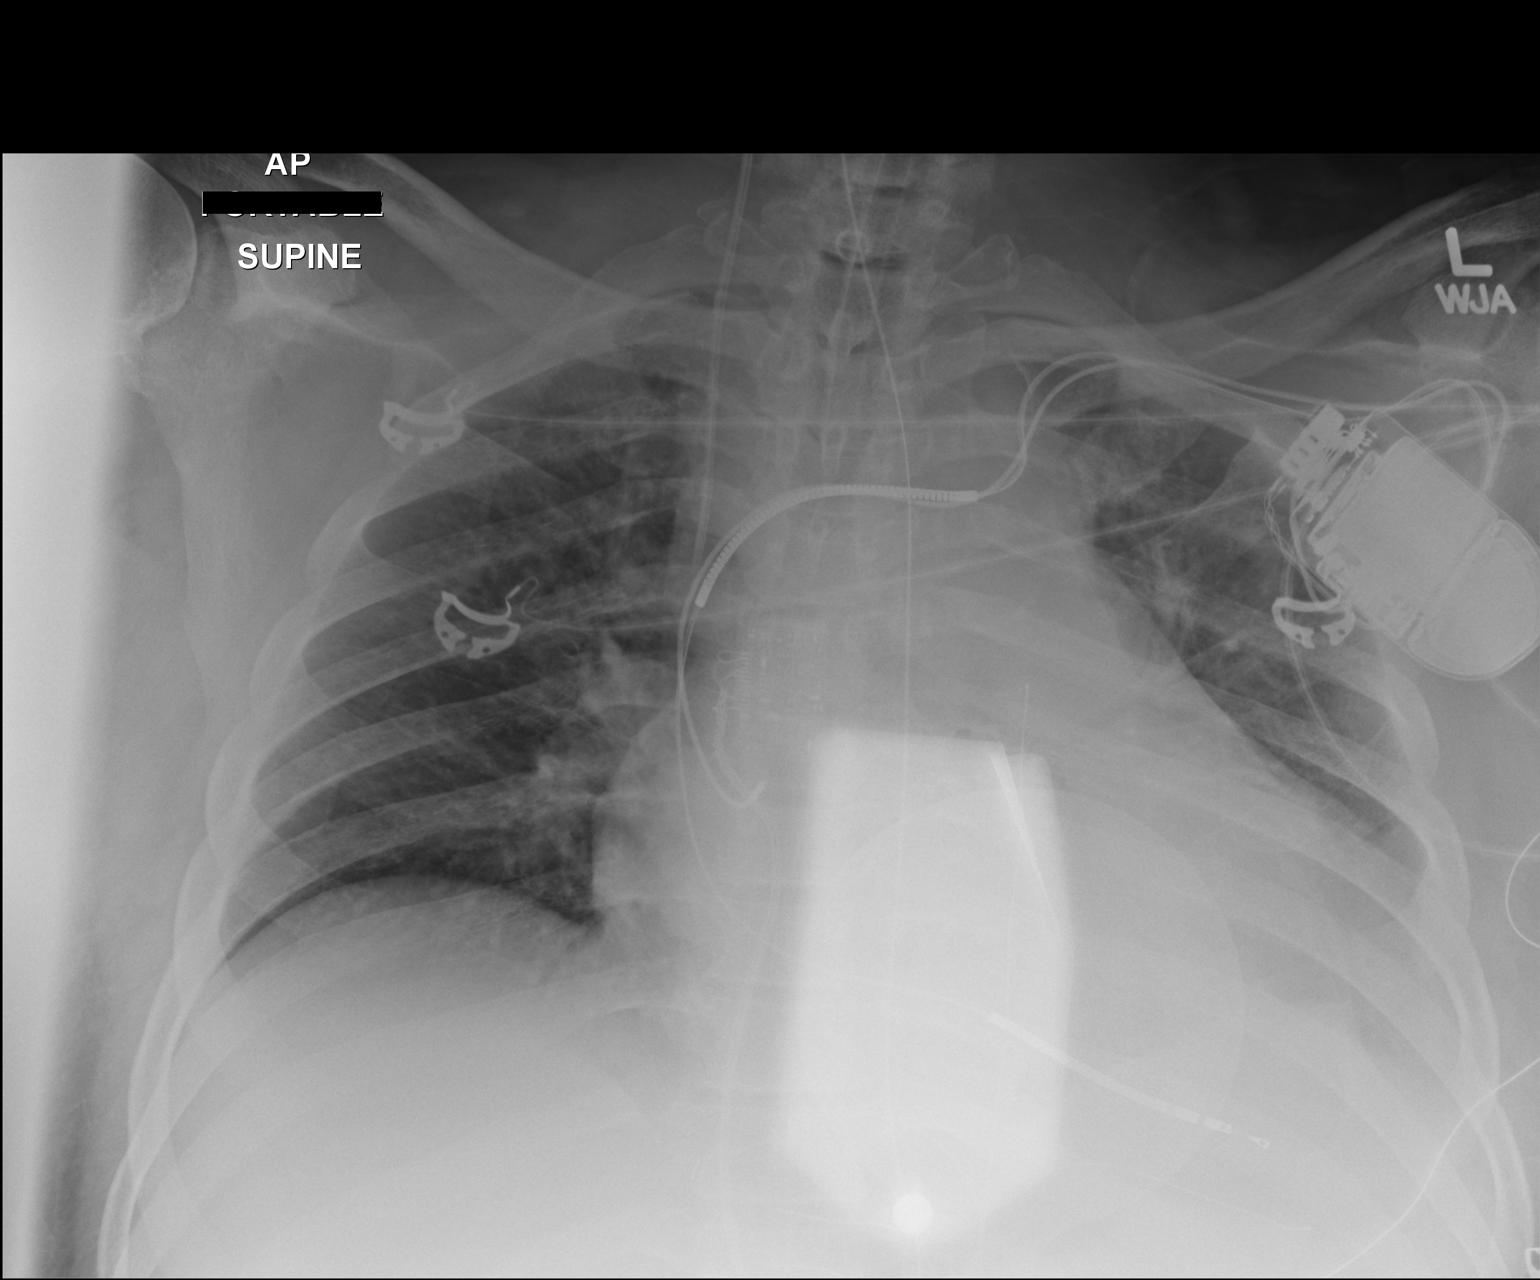

[1 of 1 positions shown; findings below may reference images not displayed]

FINDINGS: The external pacing pads are noted, obscuring detail. Left-sided
AICD in place. Right IJ central line tip terminates over the mid
SVC. Endotracheal tube is appropriately positioned. Nasogastric tube
tip terminates below the level of the hemidiaphragms but is not
included in the field of view. Moderate enlargement of the cardiac
silhouette is noted with crowding of the bronchovascular markings
but no focal pulmonary opacity.
IMPRESSION: Cardiomegaly without focal acute finding allowing for technique. If
symptoms persist, consider PA and lateral chest radiographs obtained
at full inspiration when the patient is clinically able.

## 2016-05-15 IMAGING — CR DG CHEST 1V PORT
1 series · 1 of 1 positions shown · non-contrast
Comparison: 08/21/2014

CLINICAL DATA: Cardiac arrest

EXAM:
PORTABLE CHEST - 1 VIEW

[AP]
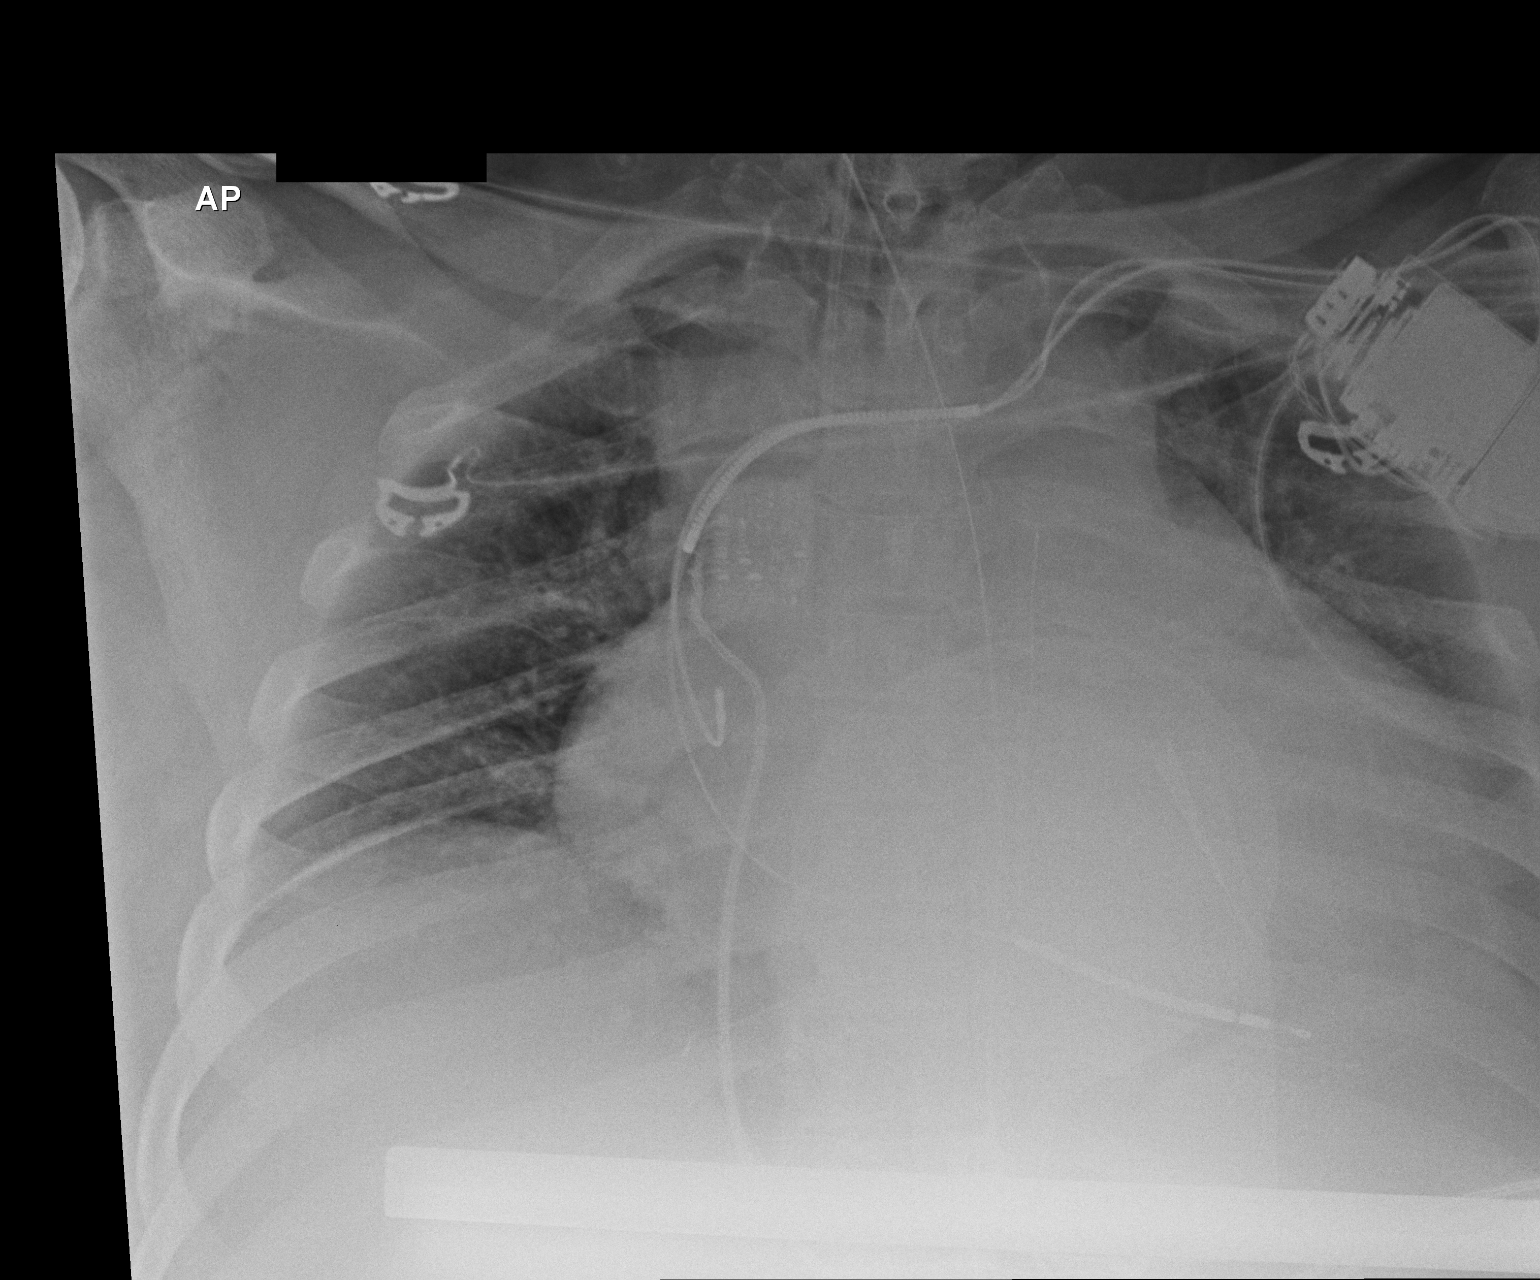

[1 of 1 positions shown; findings below may reference images not displayed]

FINDINGS: Cardiac shadow remains enlarged. A defibrillator is again noted and
stable. A nasogastric catheter is seen within stomach. An
endotracheal tube is noted approximately 4.6 cm above the carina.
The lungs are clear.
IMPRESSION: Tubes and lines as described.  No acute abnormality noted.
